# Patient Record
Sex: Female | Born: 1970 | Race: White | Hispanic: No | Smoking: Current every day smoker
Health system: Southern US, Community
[De-identification: ages and names within clinical notes are randomized; demographics above are authoritative.]

## PROBLEM LIST (undated history)

## (undated) DIAGNOSIS — R011 Cardiac murmur, unspecified: Secondary | ICD-10-CM

## (undated) DIAGNOSIS — R87629 Unspecified abnormal cytological findings in specimens from vagina: Secondary | ICD-10-CM

## (undated) HISTORY — DX: Cardiac murmur, unspecified: R01.1

## (undated) HISTORY — PX: APPENDECTOMY: SHX54

## (undated) HISTORY — PX: TUBAL LIGATION: SHX77

## (undated) HISTORY — DX: Unspecified abnormal cytological findings in specimens from vagina: R87.629

---

## 1998-06-12 ENCOUNTER — Encounter: Admission: RE | Admit: 1998-06-12 | Discharge: 1998-09-10 | Payer: Self-pay | Admitting: Family Medicine

## 2010-12-24 ENCOUNTER — Other Ambulatory Visit: Payer: Self-pay | Admitting: Gastroenterology

## 2010-12-24 DIAGNOSIS — R110 Nausea: Secondary | ICD-10-CM

## 2010-12-30 ENCOUNTER — Inpatient Hospital Stay: Admission: RE | Admit: 2010-12-30 | Payer: Self-pay | Source: Ambulatory Visit

## 2013-09-30 ENCOUNTER — Other Ambulatory Visit: Payer: Self-pay | Admitting: Gastroenterology

## 2013-09-30 DIAGNOSIS — R634 Abnormal weight loss: Secondary | ICD-10-CM

## 2013-09-30 DIAGNOSIS — R110 Nausea: Secondary | ICD-10-CM

## 2013-09-30 DIAGNOSIS — R109 Unspecified abdominal pain: Secondary | ICD-10-CM

## 2013-10-04 ENCOUNTER — Ambulatory Visit
Admission: RE | Admit: 2013-10-04 | Discharge: 2013-10-04 | Disposition: A | Discharge status: Home / Self Care | Payer: BC Managed Care – PPO | Source: Ambulatory Visit | Attending: Gastroenterology | Admitting: Gastroenterology

## 2013-10-04 DIAGNOSIS — R110 Nausea: Secondary | ICD-10-CM

## 2013-10-04 DIAGNOSIS — R634 Abnormal weight loss: Secondary | ICD-10-CM

## 2013-10-04 DIAGNOSIS — R109 Unspecified abdominal pain: Secondary | ICD-10-CM

## 2013-10-04 MED ORDER — IOHEXOL 300 MG/ML  SOLN
100.0000 mL | Freq: Once | INTRAMUSCULAR | Status: AC | PRN
  Administered 2013-10-04: 100 mL via INTRAVENOUS

## 2015-11-25 HISTORY — PX: APPENDECTOMY: SHX54

## 2016-02-28 ENCOUNTER — Encounter (HOSPITAL_COMMUNITY): Payer: Self-pay | Admitting: Emergency Medicine

## 2016-02-28 ENCOUNTER — Observation Stay (HOSPITAL_COMMUNITY)
Admission: EM | Admit: 2016-02-28 | Discharge: 2016-03-01 | Disposition: A | Discharge status: Other Acute Inpatient Hospital | Payer: Self-pay | Attending: General Surgery | Admitting: General Surgery

## 2016-02-28 DIAGNOSIS — R1031 Right lower quadrant pain: Secondary | ICD-10-CM

## 2016-02-28 DIAGNOSIS — K3580 Unspecified acute appendicitis: Principal | ICD-10-CM | POA: Insufficient documentation

## 2016-02-28 DIAGNOSIS — F17210 Nicotine dependence, cigarettes, uncomplicated: Secondary | ICD-10-CM | POA: Insufficient documentation

## 2016-02-28 LAB — COMPREHENSIVE METABOLIC PANEL
ALBUMIN: 4.1 g/dL (ref 3.5–5.0)
ALT: 19 U/L (ref 14–54)
ANION GAP: 17 — AB (ref 5–15)
AST: 21 U/L (ref 15–41)
Alkaline Phosphatase: 97 U/L (ref 38–126)
BUN: 8 mg/dL (ref 6–20)
CHLORIDE: 103 mmol/L (ref 101–111)
CO2: 17 mmol/L — AB (ref 22–32)
Calcium: 9.7 mg/dL (ref 8.9–10.3)
Creatinine, Ser: 1.03 mg/dL — ABNORMAL HIGH (ref 0.44–1.00)
GFR calc Af Amer: >60 mL/min (ref >60)
GFR calc non Af Amer: >60 mL/min (ref >60)
GLUCOSE: 120 mg/dL — AB (ref 65–99)
POTASSIUM: 3.9 mmol/L (ref 3.5–5.1)
SODIUM: 137 mmol/L (ref 135–145)
Total Bilirubin: 1.1 mg/dL (ref 0.3–1.2)
Total Protein: 8 g/dL (ref 6.5–8.1)

## 2016-02-28 LAB — URINALYSIS, ROUTINE W REFLEX MICROSCOPIC
Glucose, UA: NEGATIVE mg/dL
Ketones, ur: >80 mg/dL — AB
Leukocytes, UA: NEGATIVE
NITRITE: NEGATIVE
PH: 5.5 (ref 5.0–8.0)
Protein, ur: 30 mg/dL — AB
SPECIFIC GRAVITY, URINE: 1.026 (ref 1.005–1.030)

## 2016-02-28 LAB — URINE MICROSCOPIC-ADD ON

## 2016-02-28 LAB — CBC
HEMATOCRIT: 47.2 % — AB (ref 36.0–46.0)
HEMOGLOBIN: 15.8 g/dL — AB (ref 12.0–15.0)
MCH: 30.5 pg (ref 26.0–34.0)
MCHC: 33.5 g/dL (ref 30.0–36.0)
MCV: 91.1 fL (ref 78.0–100.0)
Platelets: 278 10*3/uL (ref 150–400)
RBC: 5.18 MIL/uL — AB (ref 3.87–5.11)
RDW: 13.5 % (ref 11.5–15.5)
WBC: 14.9 10*3/uL — ABNORMAL HIGH (ref 4.0–10.5)

## 2016-02-28 LAB — LIPASE, BLOOD: LIPASE: 18 U/L (ref 11–51)

## 2016-02-28 MED ORDER — ONDANSETRON 4 MG PO TBDP
ORAL_TABLET | ORAL | Status: AC
  Filled 2016-02-28: qty 1

## 2016-02-28 MED ORDER — ONDANSETRON 4 MG PO TBDP
4.0000 mg | ORAL_TABLET | Freq: Once | ORAL | Status: AC | PRN
  Administered 2016-02-28: 4 mg via ORAL

## 2016-02-28 NOTE — ED Provider Notes (Signed)
CSN: [id number]     Arrival date & time 02/28/16  2229 History   First MD Initiated Contact with Patient 02/28/16 2334     Chief Complaint  Patient presents with  . Abdominal Pain     (Consider location/radiation/quality/duration/timing/severity/associated sxs/prior Treatment) HPI  Morgan Orozco is a 45 year old female with a past medical history of bipolar disorder presents the ED complaining of right lower quadrant abdominal pain. 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 Throughout The day the pain progressively worsened and is now sharp in nature. Pain is worsened with movement and walking. Patient had associated vomiting and diarrhea, nonbloody, nonbilious. Denies fever, dysuria, hematuria, melena, hematochezia. Patient is currently menstruating. Patient has had her tubes tied, no other abdominal surgeries.  History reviewed. No pertinent past medical history. History reviewed. No pertinent past surgical history. No family history on file. Social History  Substance Use Topics  . Smoking status: Current Every Day Smoker -- 0.50 packs/day    Types: Cigarettes  . Smokeless tobacco: None  . Alcohol Use: Yes     Comment: occasional    OB History    No data available     Review of Systems  All other systems reviewed and are negative.     Allergies  Review of patient's allergies indicates no known allergies.  Home Medications   Prior to Admission medications   Not on File   BP 128/95 mmHg  Pulse 109  Temp(Src) 97.9 F (36.6 C) (Oral)  Resp 16  Ht 5\' 8"  (1.727 m)  Wt 99.791 kg  BMI 33.46 kg/m2  SpO2 98%  LMP 02/28/2016 Physical Exam  Constitutional: She is oriented to person, place, and time. She appears well-developed and well-nourished. No distress.  HENT:  Head: Normocephalic and atraumatic.  Mouth/Throat: No oropharyngeal exudate.  Eyes: Conjunctivae and EOM are normal.  Pupils are equal, round, and reactive to light. Right eye exhibits no discharge. Left eye exhibits no discharge. No scleral icterus.  Cardiovascular: Normal rate, regular rhythm, normal heart sounds and intact distal pulses.  Exam reveals no gallop and no friction rub.   No murmur heard. Pulmonary/Chest: Effort normal and breath sounds normal. No respiratory distress. She has no wheezes. She has no rales. She exhibits no tenderness.  Abdominal: Soft. She exhibits no distension. There is tenderness. There is rebound. There is no guarding.  Positive Rosving's. Rebound tenderness present. Positive Heel Jar. No CVA tenderness.  Musculoskeletal: Normal range of motion. She exhibits no edema.  Neurological: She is alert and oriented to person, place, and time.  Skin: Skin is warm and dry. No rash noted. She is not diaphoretic. No erythema. No pallor.  Psychiatric: She has a normal mood and affect. Her behavior is normal.  Nursing note and vitals reviewed.   ED Course  Procedures (including critical care time) Labs Review Labs Reviewed  COMPREHENSIVE METABOLIC PANEL - Abnormal; Notable for the following:    CO2 17 (*)    Glucose, Bld 120 (*)    Creatinine, Ser 1.03 (*)    Anion gap 17 (*)    All other components within normal limits  CBC - Abnormal; Notable for the following:    WBC 14.9 (*)    RBC 5.18 (*)    Hemoglobin 15.8 (*)    HCT 47.2 (*)    All other components within normal limits  URINALYSIS, ROUTINE W REFLEX MICROSCOPIC (  NOT AT [facility]) - Abnormal; Notable for the following:    Color, Urine AMBER (*)    APPearance TURBID (*)    Hgb urine dipstick MODERATE (*)    Bilirubin Urine MODERATE (*)    Ketones, ur >80 (*)    Protein, ur 30 (*)    All other components within normal limits  URINE MICROSCOPIC-ADD ON - Abnormal; Notable for the following:    Squamous Epithelial / LPF 6-30 (*)    Bacteria, UA RARE (*)    Casts HYALINE CASTS (*)    All other components within normal limits   LIPASE, BLOOD    Imaging Review No results found. I have personally reviewed and evaluated these images and lab results as part of my medical decision-making.   EKG Interpretation None      MDM   Final diagnoses:  Right lower quadrant abdominal pain     45 year old female presents the ED with gradual onset and progressively worsening right lower quadrant abdominal pain associated with vomiting and diarrhea. Patient is significantly tender on abdominal exam with guarding. Leukocytosis present to be BC 14.9. Concern for appendicitis. We'll obtain CT to evaluate. Pain managed in ED.  Pt signed out to Dr. Leonides Schanz at shift change pending CT results. If positive for appendicitis, will consult surgery and treat accordingly.   Patient was discussed with and seen by Dr. Leonides Schanz who agrees with the treatment plan.      [place], PA-C 02/29/16 [place], DO 02/29/16 [phone]

## 2016-02-28 NOTE — ED Notes (Signed)
Pt states she woke up at 1400 today and had serve RLQ pain with n/v/d.

## 2016-02-29 ENCOUNTER — Encounter (HOSPITAL_COMMUNITY): Payer: Self-pay | Admitting: Anesthesiology

## 2016-02-29 ENCOUNTER — Emergency Department (HOSPITAL_COMMUNITY): Payer: Self-pay

## 2016-02-29 ENCOUNTER — Observation Stay (HOSPITAL_COMMUNITY): Payer: Self-pay | Admitting: Anesthesiology

## 2016-02-29 ENCOUNTER — Encounter (HOSPITAL_COMMUNITY)
Admission: EM | Disposition: A | Discharge status: Other Acute Inpatient Hospital | Payer: Self-pay | Source: Home / Self Care | Attending: Emergency Medicine

## 2016-02-29 DIAGNOSIS — K3580 Unspecified acute appendicitis: Secondary | ICD-10-CM | POA: Diagnosis present

## 2016-02-29 HISTORY — PX: LAPAROSCOPIC APPENDECTOMY: SHX408

## 2016-02-29 LAB — PREGNANCY, URINE: PREG TEST UR: NEGATIVE

## 2016-02-29 SURGERY — APPENDECTOMY, LAPAROSCOPIC
Anesthesia: General

## 2016-02-29 MED ORDER — ONDANSETRON 4 MG PO TBDP: 4.0000 mg | ORAL_TABLET | Freq: Four times a day (QID) | ORAL | Status: DC | PRN

## 2016-02-29 MED ORDER — SODIUM CHLORIDE 0.9 % IV BOLUS (SEPSIS)
500.0000 mL | Freq: Once | INTRAVENOUS | Status: AC
  Administered 2016-02-29: 500 mL via INTRAVENOUS

## 2016-02-29 MED ORDER — CEFTRIAXONE SODIUM 2 G IJ SOLR
2.0000 g | INTRAMUSCULAR | Status: DC
  Administered 2016-03-01: 2 g via INTRAVENOUS
  Filled 2016-02-29: qty 2

## 2016-02-29 MED ORDER — HEPARIN SODIUM (PORCINE) 5000 UNIT/ML IJ SOLN
5000.0000 [IU] | Freq: Three times a day (TID) | INTRAMUSCULAR | Status: DC
  Administered 2016-03-01: 5000 [IU] via SUBCUTANEOUS
  Filled 2016-02-29 (×2): qty 1

## 2016-02-29 MED ORDER — ENOXAPARIN SODIUM 40 MG/0.4ML ~~LOC~~ SOLN
40.0000 mg | SUBCUTANEOUS | Status: DC
  Filled 2016-02-29: qty 0.4

## 2016-02-29 MED ORDER — MORPHINE SULFATE (PF) 4 MG/ML IV SOLN
4.0000 mg | Freq: Once | INTRAVENOUS | Status: AC
  Administered 2016-02-29: 4 mg via INTRAVENOUS
  Filled 2016-02-29: qty 1

## 2016-02-29 MED ORDER — METRONIDAZOLE IN NACL 5-0.79 MG/ML-% IV SOLN
500.0000 mg | Freq: Once | INTRAVENOUS | Status: AC
  Administered 2016-02-29: 500 mg via INTRAVENOUS
  Filled 2016-02-29: qty 100

## 2016-02-29 MED ORDER — ACETAMINOPHEN 325 MG PO TABS: 650.0000 mg | ORAL_TABLET | Freq: Four times a day (QID) | ORAL | Status: DC | PRN

## 2016-02-29 MED ORDER — ONDANSETRON HCL 4 MG/2ML IJ SOLN
4.0000 mg | Freq: Once | INTRAMUSCULAR | Status: AC
  Administered 2016-02-29: 4 mg via INTRAVENOUS
  Filled 2016-02-29: qty 2

## 2016-02-29 MED ORDER — ONDANSETRON HCL 4 MG/2ML IJ SOLN: 4.0000 mg | Freq: Four times a day (QID) | INTRAMUSCULAR | Status: DC | PRN

## 2016-02-29 MED ORDER — LACTATED RINGERS IV SOLN
INTRAVENOUS | Status: DC | PRN
  Administered 2016-02-29 (×2): via INTRAVENOUS

## 2016-02-29 MED ORDER — SUGAMMADEX SODIUM 200 MG/2ML IV SOLN
INTRAVENOUS | Status: DC | PRN
  Administered 2016-02-29: 200 mg via INTRAVENOUS

## 2016-02-29 MED ORDER — MEPERIDINE HCL 50 MG/ML IJ SOLN: 6.2500 mg | INTRAMUSCULAR | Status: DC | PRN

## 2016-02-29 MED ORDER — METOCLOPRAMIDE HCL 5 MG/ML IJ SOLN: 10.0000 mg | Freq: Once | INTRAMUSCULAR | Status: DC | PRN

## 2016-02-29 MED ORDER — DIATRIZOATE MEGLUMINE & SODIUM 66-10 % PO SOLN
ORAL | Status: AC
  Filled 2016-02-29: qty 30

## 2016-02-29 MED ORDER — MORPHINE SULFATE (PF) 2 MG/ML IV SOLN
1.0000 mg | INTRAVENOUS | Status: DC | PRN
  Administered 2016-02-29 (×3): 2 mg via INTRAVENOUS
  Filled 2016-02-29 (×3): qty 1

## 2016-02-29 MED ORDER — LACTATED RINGERS IV SOLN
INTRAVENOUS | Status: DC | PRN
  Administered 2016-02-29: 2000 mL

## 2016-02-29 MED ORDER — ONDANSETRON HCL 4 MG/2ML IJ SOLN: 4.0000 mg | INTRAMUSCULAR | Status: DC | PRN

## 2016-02-29 MED ORDER — IOPAMIDOL (ISOVUE-300) INJECTION 61%
INTRAVENOUS | Status: AC
  Administered 2016-02-29: 100 mL
  Filled 2016-02-29: qty 100

## 2016-02-29 MED ORDER — BUPIVACAINE HCL (PF) 0.5 % IJ SOLN
INTRAMUSCULAR | Status: DC | PRN
  Administered 2016-02-29: 20 mL

## 2016-02-29 MED ORDER — 0.9 % SODIUM CHLORIDE (POUR BTL) OPTIME
TOPICAL | Status: DC | PRN
  Administered 2016-02-29: 1000 mL

## 2016-02-29 MED ORDER — ROCURONIUM BROMIDE 100 MG/10ML IV SOLN
INTRAVENOUS | Status: DC | PRN
  Administered 2016-02-29: 30 mg via INTRAVENOUS
  Administered 2016-02-29 (×2): 10 mg via INTRAVENOUS

## 2016-02-29 MED ORDER — LIDOCAINE HCL (CARDIAC) 20 MG/ML IV SOLN
INTRAVENOUS | Status: DC | PRN
  Administered 2016-02-29: 100 mg via INTRAVENOUS

## 2016-02-29 MED ORDER — FENTANYL CITRATE (PF) 100 MCG/2ML IJ SOLN
INTRAMUSCULAR | Status: AC
  Filled 2016-02-29: qty 2

## 2016-02-29 MED ORDER — DEXAMETHASONE SODIUM PHOSPHATE 10 MG/ML IJ SOLN
INTRAMUSCULAR | Status: DC | PRN
  Administered 2016-02-29: 10 mg via INTRAVENOUS

## 2016-02-29 MED ORDER — ONDANSETRON HCL 4 MG/2ML IJ SOLN
INTRAMUSCULAR | Status: DC | PRN
  Administered 2016-02-29: 4 mg via INTRAVENOUS

## 2016-02-29 MED ORDER — FENTANYL CITRATE (PF) 100 MCG/2ML IJ SOLN
INTRAMUSCULAR | Status: DC | PRN
  Administered 2016-02-29 (×2): 50 ug via INTRAVENOUS
  Administered 2016-02-29: 150 ug via INTRAVENOUS
  Administered 2016-02-29 (×2): 50 ug via INTRAVENOUS

## 2016-02-29 MED ORDER — HYDROCODONE-ACETAMINOPHEN 5-325 MG PO TABS
1.0000 | ORAL_TABLET | ORAL | Status: DC | PRN
  Administered 2016-02-29 – 2016-03-01 (×4): 2 via ORAL
  Filled 2016-02-29 (×4): qty 2

## 2016-02-29 MED ORDER — METRONIDAZOLE IN NACL 5-0.79 MG/ML-% IV SOLN
500.0000 mg | Freq: Three times a day (TID) | INTRAVENOUS | Status: DC
  Administered 2016-02-29 – 2016-03-01 (×3): 500 mg via INTRAVENOUS
  Filled 2016-02-29 (×3): qty 100

## 2016-02-29 MED ORDER — FENTANYL CITRATE (PF) 100 MCG/2ML IJ SOLN
25.0000 ug | INTRAMUSCULAR | Status: DC | PRN
  Administered 2016-02-29 (×2): 25 ug via INTRAVENOUS
  Administered 2016-02-29: 50 ug via INTRAVENOUS

## 2016-02-29 MED ORDER — DEXTROSE 5 % IV SOLN
1.0000 g | Freq: Once | INTRAVENOUS | Status: AC
  Administered 2016-02-29: 1 g via INTRAVENOUS
  Filled 2016-02-29: qty 10

## 2016-02-29 MED ORDER — DEXTROSE-NACL 5-0.9 % IV SOLN
INTRAVENOUS | Status: DC
  Administered 2016-02-29: 100 mL/h via INTRAVENOUS
  Administered 2016-02-29: 14:00:00 via INTRAVENOUS

## 2016-02-29 MED ORDER — MORPHINE SULFATE (PF) 2 MG/ML IV SOLN
2.0000 mg | INTRAVENOUS | Status: DC | PRN
  Administered 2016-02-29 – 2016-03-01 (×3): 4 mg via INTRAVENOUS
  Administered 2016-03-01: 2 mg via INTRAVENOUS
  Filled 2016-02-29: qty 2
  Filled 2016-02-29: qty 1
  Filled 2016-02-29 (×2): qty 2

## 2016-02-29 MED ORDER — MIDAZOLAM HCL 2 MG/2ML IJ SOLN
INTRAMUSCULAR | Status: DC | PRN
  Administered 2016-02-29: 2 mg via INTRAVENOUS

## 2016-02-29 MED ORDER — PROPOFOL 10 MG/ML IV BOLUS
INTRAVENOUS | Status: DC | PRN
  Administered 2016-02-29: 150 mg via INTRAVENOUS

## 2016-02-29 MED ORDER — SODIUM CHLORIDE 0.9 % IV BOLUS (SEPSIS)
1000.0000 mL | Freq: Once | INTRAVENOUS | Status: AC
  Administered 2016-02-29: 1000 mL via INTRAVENOUS

## 2016-02-29 MED ORDER — SUCCINYLCHOLINE CHLORIDE 20 MG/ML IJ SOLN
INTRAMUSCULAR | Status: DC | PRN
  Administered 2016-02-29: 100 mg via INTRAVENOUS

## 2016-02-29 MED ORDER — KCL IN DEXTROSE-NACL 20-5-0.9 MEQ/L-%-% IV SOLN
INTRAVENOUS | Status: DC
  Filled 2016-02-29 (×3): qty 1000

## 2016-02-29 MED ORDER — ACETAMINOPHEN 650 MG RE SUPP: 650.0000 mg | Freq: Four times a day (QID) | RECTAL | Status: DC | PRN

## 2016-02-29 MED ORDER — BUPIVACAINE HCL (PF) 0.5 % IJ SOLN
INTRAMUSCULAR | Status: AC
  Filled 2016-02-29: qty 30

## 2016-02-29 SURGICAL SUPPLY — 50 items
APL SKNCLS STERI-STRIP NONHPOA (GAUZE/BANDAGES/DRESSINGS) ×1
APPLIER CLIP 5 13 M/L LIGAMAX5 (MISCELLANEOUS)
APPLIER CLIP ROT 10 11.4 M/L (STAPLE)
APR CLP MED LRG 11.4X10 (STAPLE)
APR CLP MED LRG 5 ANG JAW (MISCELLANEOUS)
BAG SPEC RTRVL LRG 6X4 10 (ENDOMECHANICALS) ×1
BENZOIN TINCTURE PRP APPL 2/3 (GAUZE/BANDAGES/DRESSINGS) ×3 IMPLANT
CHLORAPREP W/TINT 26ML (MISCELLANEOUS) ×3 IMPLANT
CLIP APPLIE 5 13 M/L LIGAMAX5 (MISCELLANEOUS) IMPLANT
CLIP APPLIE ROT 10 11.4 M/L (STAPLE) IMPLANT
CLOSURE WOUND 1/2 X4 (GAUZE/BANDAGES/DRESSINGS) ×1
COVER SURGICAL LIGHT HANDLE (MISCELLANEOUS) ×3 IMPLANT
CUTTER FLEX LINEAR 45M (STAPLE) ×2 IMPLANT
DECANTER SPIKE VIAL GLASS SM (MISCELLANEOUS) ×3 IMPLANT
DRAIN CHANNEL 19F RND (DRAIN) IMPLANT
DRAPE LAPAROSCOPIC ABDOMINAL (DRAPES) ×3 IMPLANT
DRSG TEGADERM 2-3/8X2-3/4 SM (GAUZE/BANDAGES/DRESSINGS) ×4 IMPLANT
ELECT REM PT RETURN 9FT ADLT (ELECTROSURGICAL) ×3
ELECTRODE REM PT RTRN 9FT ADLT (ELECTROSURGICAL) ×1 IMPLANT
ENDOLOOP SUT PDS II  0 18 (SUTURE)
ENDOLOOP SUT PDS II 0 18 (SUTURE) IMPLANT
EVACUATOR SILICONE 100CC (DRAIN) IMPLANT
GAUZE SPONGE 2X2 8PLY STRL LF (GAUZE/BANDAGES/DRESSINGS) ×1 IMPLANT
GLOVE ECLIPSE 8.0 STRL XLNG CF (GLOVE) ×3 IMPLANT
GLOVE INDICATOR 8.0 STRL GRN (GLOVE) ×3 IMPLANT
GOWN STRL REUS W/TWL XL LVL3 (GOWN DISPOSABLE) ×6 IMPLANT
KIT BASIN OR (CUSTOM PROCEDURE TRAY) ×3 IMPLANT
POUCH SPECIMEN RETRIEVAL 10MM (ENDOMECHANICALS) ×3 IMPLANT
RELOAD 45 VASCULAR/THIN (ENDOMECHANICALS) IMPLANT
RELOAD STAPLE 45 2.5 WHT GRN (ENDOMECHANICALS) IMPLANT
RELOAD STAPLE 45 3.5 BLU ETS (ENDOMECHANICALS) IMPLANT
RELOAD STAPLE TA45 3.5 REG BLU (ENDOMECHANICALS) ×3 IMPLANT
SCISSORS LAP 5X35 DISP (ENDOMECHANICALS) IMPLANT
SET IRRIG TUBING LAPAROSCOPIC (IRRIGATION / IRRIGATOR) ×3 IMPLANT
SHEARS HARMONIC ACE PLUS 36CM (ENDOMECHANICALS) ×3 IMPLANT
SLEEVE XCEL OPT CAN 5 100 (ENDOMECHANICALS) ×3 IMPLANT
SOLUTION ANTI FOG 6CC (MISCELLANEOUS) ×3 IMPLANT
SPONGE GAUZE 2X2 STER 10/PKG (GAUZE/BANDAGES/DRESSINGS) ×2
STRIP CLOSURE SKIN 1/2X4 (GAUZE/BANDAGES/DRESSINGS) ×2 IMPLANT
SUT ETHILON 3 0 PS 1 (SUTURE) IMPLANT
SUT MNCRL AB 4-0 PS2 18 (SUTURE) ×3 IMPLANT
TAPE STRIPS DRAPE STRL (GAUZE/BANDAGES/DRESSINGS) ×2 IMPLANT
TOWEL OR 17X26 10 PK STRL BLUE (TOWEL DISPOSABLE) ×3 IMPLANT
TOWEL OR NON WOVEN STRL DISP B (DISPOSABLE) ×3 IMPLANT
TRAY FOLEY W/METER SILVER 14FR (SET/KITS/TRAYS/PACK) ×3 IMPLANT
TRAY FOLEY W/METER SILVER 16FR (SET/KITS/TRAYS/PACK) ×3 IMPLANT
TRAY LAPAROSCOPIC (CUSTOM PROCEDURE TRAY) ×3 IMPLANT
TROCAR BLADELESS OPT 5 100 (ENDOMECHANICALS) ×3 IMPLANT
TROCAR XCEL BLUNT TIP 100MML (ENDOMECHANICALS) ×3 IMPLANT
TUBING INSUF HEATED (TUBING) ×3 IMPLANT

## 2016-02-29 NOTE — Anesthesia Postprocedure Evaluation (Signed)
Anesthesia Post Note  Patient: Morgan Orozco  Procedure(s) Performed: Procedure(s) (LRB): APPENDECTOMY LAPAROSCOPIC (N/A)  Patient location during evaluation: PACU Anesthesia Type: General Level of consciousness: awake and alert and oriented Pain management: pain level controlled Vital Signs Assessment: post-procedure vital signs reviewed and stable Respiratory status: spontaneous breathing, nonlabored ventilation, respiratory function stable and patient connected to nasal cannula oxygen Cardiovascular status: blood pressure returned to baseline and stable Postop Assessment: no signs of nausea or vomiting Anesthetic complications: no    Last Vitals:  Filed Vitals:   02/29/16 1253 02/29/16 1302  BP:  126/75  Pulse:  79  Temp: 36 C 37.3 C  Resp:  16    Last Pain:  Filed Vitals:   02/29/16 1303  PainSc: 5                  Syriana Croslin A.

## 2016-02-29 NOTE — ED Notes (Signed)
Lab to add urine pregnancy

## 2016-02-29 NOTE — Op Note (Signed)
Appendectomy, Lap, Procedure Note  Pre-operative Diagnosis:  Acute appendicitis  Post-operative Diagnosis: Same with early gangrenous changes  Procedure:  Laparoscopic appendectomy  Surgeon:  Jackolyn Confer, M.D.  Anesthesia:  General   Indications:  This is a 45 year old female with clinical and radiographic findings consistent with acute appendicitis.  She is brought to the OR for the above procedure.   Procedure Details   She was brought to the operating room, placed in the supine position and general anesthesia was induced, along with placement of orogastric tube, SCDs, and a Foley catheter. A timeout was performed. The abdomen was prepped and draped in a sterile fashion. A small infraumbilical incision was made, through a previous subumbilical scar, in the skin, subcutaneous tissue, fascia, and peritoneum entering the peritoneal cavity under direct vision.  A pursestring suture was passed around the fascia with a 0 Vicryl.  The Hasson was introduced into the peritoneal cavity and the tails of the suture were used to hold the Hasson in place.   The pneumoperitoneum was then established to steady pressure of 15 mmHg.   The laparoscope was introduced and there was no evidence of bleeding or underlying organ injury. Additional 5 mm cannulas then placed in the left lower quadrant of the abdomen and the right upper quadrant region under direct visualization. A careful evaluation of the entire abdomen was carried out. She was placed in Trendelenburg and left lateral decubitus position. The small intestines were retracted in the cephalad and left lateral direction away from the pelvis and right lower quadrant. The patient was found to have an enlarged and inflamed appendix that was extending into the pelvis with early gangrenous changes.  The tip was adherent to pericolonic fat on the sigmoid colon and this was separated bluntly. There was no evidence of perforation.  The appendix was carefully  mobilized. The mesoappendix was was divided with the harmonic scalpel.   The appendix was amputated off the cecum, with a small cuff of cecum, using an endo-GIA stapler.  The appendix was placed in a retrieval bag and removed through the subumbilical port incision.    There was no evidence of bleeding, leakage, or complication after division of the appendix. Copious irrigation was  performed and irrigant fluid suctioned from the abdomen as much as possible.  The umbilical trocar was removed and the  port site fascia was closed via the purse string suture under laparoscopic vision. There was no residual palpable fascial defect.  The remaining trocars were removed and all  trocar site skin wounds were closed with 4-0 Monocryl.  Steri dressings were applied.  Instrument, sponge, and needle counts were correct at the conclusion of the case.    Estimated Blood Loss:  less than 100 mL                Specimens: appendix         Complications:  None; patient tolerated the procedure well.         Disposition: PACU - hemodynamically stable.         Condition: stable

## 2016-02-29 NOTE — Transfer of Care (Signed)
Immediate Anesthesia Transfer of Care Note  Patient: Morgan Orozco  Procedure(s) Performed: Procedure(s): APPENDECTOMY LAPAROSCOPIC (N/A)  Patient Location: PACU  Anesthesia Type:General  Level of Consciousness: awake, alert  and oriented  Airway & Oxygen Therapy: Patient Spontanous Breathing and Patient connected to face mask oxygen  Post-op Assessment: Report given to RN and Post -op Vital signs reviewed and stable  Post vital signs: Reviewed and stable  Last Vitals:  Filed Vitals:   02/29/16 0915 02/29/16 0922  BP: 118/55   Pulse: 77   Temp:  37.6 C  Resp:      Complications: No apparent anesthesia complications

## 2016-02-29 NOTE — Anesthesia Procedure Notes (Signed)
Procedure Name: Intubation Date/Time: 02/29/2016 10:44 AM Performed by: Danley Danker L Patient Re-evaluated:Patient Re-evaluated prior to inductionOxygen Delivery Method: Circle system utilized Preoxygenation: Pre-oxygenation with 100% oxygen Intubation Type: IV induction, Rapid sequence and Cricoid Pressure applied Laryngoscope Size: Miller and 2 Grade View: Grade I Tube type: Oral Tube size: 7.5 mm Number of attempts: 1 Airway Equipment and Method: Stylet Placement Confirmation: ETT inserted through vocal cords under direct vision,  positive ETCO2 and breath sounds checked- equal and bilateral Secured at: 21 cm Tube secured with: Tape Dental Injury: Teeth and Oropharynx as per pre-operative assessment

## 2016-02-29 NOTE — Anesthesia Preprocedure Evaluation (Addendum)
Anesthesia Evaluation  Patient identified by MRN, date of birth, ID band Patient awake    Reviewed: Allergy & Precautions, NPO status , Patient's Chart, lab work & pertinent test results  Airway Mallampati: II  TM Distance: >3 FB Neck ROM: Full    Dental  (+) Teeth Intact   Pulmonary Current Smoker,    Pulmonary exam normal breath sounds clear to auscultation       Cardiovascular negative cardio ROS Normal cardiovascular exam Rhythm:Regular Rate:Normal     Neuro/Psych negative neurological ROS  negative psych ROS   GI/Hepatic Neg liver ROS, Acute appendicitis   Endo/Other  Obesity  Renal/GU negative Renal ROS  negative genitourinary   Musculoskeletal negative musculoskeletal ROS (+)   Abdominal (+) + obese,  Abdomen: tender.    Peds  Hematology negative hematology ROS (+)   Anesthesia Other Findings   Reproductive/Obstetrics negative OB ROS                           Anesthesia Physical Anesthesia Plan  ASA: II and emergent  Anesthesia Plan: General   Post-op Pain Management:    Induction: Intravenous, Rapid sequence and Cricoid pressure planned  Airway Management Planned: Oral ETT  Additional Equipment:   Intra-op Plan:   Post-operative Plan: Extubation in OR  Informed Consent: I have reviewed the patients History and Physical, chart, labs and discussed the procedure including the risks, benefits and alternatives for the proposed anesthesia with the patient or authorized representative who has indicated his/her understanding and acceptance.   Dental advisory given  Plan Discussed with: CRNA, Anesthesiologist and Surgeon  Anesthesia Plan Comments:         Anesthesia Quick Evaluation

## 2016-02-29 NOTE — H&P (Signed)
Chief Complaint: abdominal pain HPI: Morgan Orozco is a 45 year old female who presents with RLQ abdominal pain which started yesterday at [place] with nausea, vomiting, chills, sweats, malaise and diarrhea.  Denies fevers.  Denies previous symptoms.  No aggravating or alleviating factors. No modifying factors.  Work up reveals WBC 14.9k.  CT a/p consistent with acute appendicitis.  We have therefore been asked to evaluate.   History reviewed. No pertinent past medical history.  History reviewed. No pertinent past surgical history.  No family history on file. Social History:  reports that she has been smoking Cigarettes.  She has been smoking about 0.50 packs per day. She does not have any smokeless tobacco history on file. She reports that she drinks alcohol. Her drug history is not on file.  Allergies: No Known Allergies   (Not in a hospital admission)  Results for orders placed or performed during the hospital encounter of 02/28/16 (from the past 48 hour(s))  Lipase, blood     Status: None   Collection Time: 02/28/16 10:49 PM  Result Value Ref Range   Lipase 18 11 - 51 U/L  Comprehensive metabolic panel     Status: Abnormal   Collection Time: 02/28/16 10:49 PM  Result Value Ref Range   Sodium 137 135 - 145 mmol/L   Potassium 3.9 3.5 - 5.1 mmol/L   Chloride 103 101 - 111 mmol/L   CO2 17 (L) 22 - 32 mmol/L   Glucose, Bld 120 (H) 65 - 99 mg/dL   BUN 8 6 - 20 mg/dL   Creatinine, Ser 1.03 (H) 0.44 - 1.00 mg/dL   Calcium 9.7 8.9 - 10.3 mg/dL   Total Protein 8.0 6.5 - 8.1 g/dL   Albumin 4.1 3.5 - 5.0 g/dL   AST 21 15 - 41 U/L   ALT 19 14 - 54 U/L   Alkaline Phosphatase 97 38 - 126 U/L   Total Bilirubin 1.1 0.3 - 1.2 mg/dL   GFR calc non Af Amer >60 >60 mL/min   GFR calc Af Amer >60 >60 mL/min    Comment: (NOTE) The eGFR has been calculated using the CKD EPI equation. This calculation has not been validated in all clinical situations. eGFR's persistently <60 mL/min  signify possible Chronic Kidney Disease.    Anion gap 17 (H) 5 - 15  CBC     Status: Abnormal   Collection Time: 02/28/16 10:49 PM  Result Value Ref Range   WBC 14.9 (H) 4.0 - 10.5 K/uL   RBC 5.18 (H) 3.87 - 5.11 MIL/uL   Hemoglobin 15.8 (H) 12.0 - 15.0 g/dL   HCT 47.2 (H) 36.0 - 46.0 %   MCV 91.1 78.0 - 100.0 fL   MCH 30.5 26.0 - 34.0 pg   MCHC 33.5 30.0 - 36.0 g/dL   RDW 13.5 11.5 - 15.5 %   Platelets 278 150 - 400 K/uL  Urinalysis, Routine w reflex microscopic (not at [facility])     Status: Abnormal   Collection Time: 02/28/16 10:49 PM  Result Value Ref Range   Color, Urine AMBER (A) YELLOW    Comment: BIOCHEMICALS MAY BE AFFECTED BY COLOR   APPearance TURBID (A) CLEAR   Specific Gravity, Urine 1.026 1.005 - 1.030   pH 5.5 5.0 - 8.0   Glucose, UA NEGATIVE NEGATIVE mg/dL   Hgb urine dipstick MODERATE (A) NEGATIVE   Bilirubin Urine MODERATE (A) NEGATIVE   Ketones, ur >80 (A) NEGATIVE mg/dL   Protein, ur 30 (A)  NEGATIVE mg/dL   Nitrite NEGATIVE NEGATIVE   Leukocytes, UA NEGATIVE NEGATIVE  Urine microscopic-add on     Status: Abnormal   Collection Time: 02/28/16 10:49 PM  Result Value Ref Range   Squamous Epithelial / LPF 6-30 (A) NONE SEEN   WBC, UA 0-5 0 - 5 WBC/hpf   RBC / HPF 6-30 0 - 5 RBC/hpf   Bacteria, UA RARE (A) NONE SEEN   Casts HYALINE CASTS (A) NEGATIVE  Pregnancy, urine     Status: None   Collection Time: 02/28/16 10:49 PM  Result Value Ref Range   Preg Test, Ur NEGATIVE NEGATIVE    Comment:        THE SENSITIVITY OF THIS METHODOLOGY IS >20 mIU/mL.    Ct Abdomen Pelvis W Contrast  02/29/2016  CLINICAL DATA:  Right lower quadrant abdominal pain. Nausea vomiting and diarrhea, onset yesterday. Leukocytosis. EXAM: CT ABDOMEN AND PELVIS WITH CONTRAST TECHNIQUE: Multidetector CT imaging of the abdomen and pelvis was performed using the standard protocol following bolus administration of intravenous contrast. CONTRAST:  180m ISOVUE-300 IOPAMIDOL (ISOVUE-300)  INJECTION 61% COMPARISON:  CT 10/04/2013 FINDINGS: Lower chest:  The included lung bases are clear. Liver: Tiny hepatic hypodensity at the dome, unchanged. Focal fatty infiltration adjacent with falciform ligament. No suspicious lesion. Hepatobiliary: Gallbladder physiologically distended, no calcified stone. No biliary dilatation. Pancreas: No ductal dilatation or inflammation. Spleen: Normal. Adrenal glands: No nodule. Kidneys: Symmetric renal enhancement and excretion. No hydronephrosis. Stomach/Bowel: Stomach physiologically distended. There are no dilated or thickened small bowel loops. Small volume of stool throughout the colon without colonic wall thickening. Appendix: Dilated and fluid-filled measuring 1.3 cm containing small appendicoliths. Moderate periappendiceal inflammatory change. The appendix courses medial to the cecum in the anterior pelvis. No perforation or abscess. Vascular/Lymphatic: No retroperitoneal adenopathy. Abdominal aorta is normal in caliber. Trace atherosclerosis. Reproductive: Uterus is normal in size. Tampon in place. Prominent left adnexal vascularity and dilatation of the ovarian vein measuring 8 mm. Ovaries symmetric in size. Bladder: Bladder minimally distended. Other: No free air, free fluid, or intra-abdominal fluid collection. Musculoskeletal: There are no acute or suspicious osseous abnormalities. Mild degenerative change in the spine. IMPRESSION: 1. Uncomplicated acute appendicitis. 2. Prominent left adnexal vascularity and dilatation of the ovarian vein, can be seen with pelvic congestion. Electronically Signed   By: MJeb LeveringM.D.   On: 02/29/2016 06:40    Review of Systems  Constitutional: Positive for chills, malaise/fatigue and diaphoresis. Negative for fever and weight loss.  Eyes: Negative for blurred vision, double vision, photophobia, pain, discharge and redness.  Respiratory: Negative for cough, hemoptysis, sputum production, shortness of breath and  wheezing.   Cardiovascular: Negative for chest pain, palpitations, orthopnea, claudication, leg swelling and PND.  Gastrointestinal: Positive for nausea, vomiting, abdominal pain and diarrhea. Negative for constipation, blood in stool and melena.  Genitourinary: Negative for dysuria, urgency, frequency, hematuria and flank pain.  Musculoskeletal: Negative for myalgias, back pain, joint pain, falls and neck pain.  Neurological: Negative for dizziness, tingling, tremors, sensory change, speech change, focal weakness, seizures, loss of consciousness and weakness.    Blood pressure 120/61, pulse 99, temperature 97.9 F (36.6 C), temperature source Oral, resp. rate 16, height _0  (1.727 m), weight 99.791 kg (220 lb), last menstrual period 02/28/2016, SpO2 96 %. Physical Exam  Constitutional: She is oriented to person, place, and time. She appears well-developed and well-nourished. No distress.  HENT:  Head: Normocephalic and atraumatic.  Mouth/Throat: No oropharyngeal exudate.  Eyes: Conjunctivae and  EOM are normal. Pupils are equal, round, and reactive to light. Right eye exhibits no discharge. Left eye exhibits no discharge. No scleral icterus.  Neck: Normal range of motion. Neck supple.  Cardiovascular: Normal rate, regular rhythm and normal heart sounds.  Exam reveals no gallop and no friction rub.   No murmur heard. Respiratory: Effort normal and breath sounds normal. No respiratory distress. She has no wheezes. She has no rales. She exhibits no tenderness.  GI: Soft. Bowel sounds are normal. She exhibits no distension and no mass. There is no guarding.  TTP RLQ, rebound tenderness  Musculoskeletal: Normal range of motion. She exhibits no edema or tenderness.  Neurological: She is alert and oriented to person, place, and time.  Skin: Skin is warm and dry. No rash noted. She is not diaphoretic. No erythema. No pallor.  Psychiatric: She has a normal mood and affect. Her behavior is normal.  Judgment and thought content normal.     Assessment/Plan Acute appendicitis-due to OR availability, will transfer to [facility] for a laparoscopic appendectomy. Surgical risks discussed including infection, bleeding, injury to surrounding structures, open surgery, respiratory compromise, heart attack, DVT/PE.  The patient verbalizes understanding and wishes to proceed. ID-rocephin and flagyl  FEN-NPO, IVF [place] holding area.  EDP arranged care link.  OR aware.  Erby Pian, NP  Pager [phone] 02/29/2016, 7:38 AM

## 2016-03-01 MED ORDER — OXYCODONE HCL 5 MG PO TABS: 5.0000 mg | ORAL_TABLET | ORAL | Status: DC | PRN

## 2016-03-01 NOTE — Discharge Summary (Signed)
Physician Discharge Summary  Patient ID: Morgan Orozco MRN: [id number] DOB/AGE: [birth year] 45 y.o.  Admit date: 02/28/2016 Discharge date: 03/01/2016  Admission Diagnoses:  Acute appendiciti  Discharge Diagnoses:  Active Problems:   Acute appendicitis   Discharged Condition: good  Hospital Course: She was admitted to [facility] and taken to the OR for laparoscopic appendectomy which she tolerated well.  She was able to be discharged on POD#1. Discharge instructions were given to her.   Discharge Exam: Blood pressure 109/79, pulse 70, temperature 98.3 F (36.8 C), temperature source Oral, resp. rate 16, height 5\' 8"  (1.727 m), weight 90.719 kg (200 lb), last menstrual period 02/28/2016, SpO2 100 %.   Disposition: Discharge home.     Medication List    TAKE these medications        oxyCODONE 5 MG immediate release tablet  Commonly known as:  Oxy IR/ROXICODONE  Take 1-2 tablets (5-10 mg total) by mouth every 4 (four) hours as needed for moderate pain, severe pain or breakthrough pain.         Signed: Odis Hollingshead 03/01/2016, 9:21 AM

## 2016-03-01 NOTE — Progress Notes (Signed)
1 Day Post-Op  Subjective: Very sore.  Tolerating diet.  Getting OOB and walking to bathroom.  Objective: Vital signs in last 24 hours: Temp:  [96.8 F (36 C)-99.7 F (37.6 C)] 98.3 F (36.8 C) (04/08 0538) Pulse Rate:  [58-115] 70 (04/08 0538) Resp:  [16-26] 16 (04/08 0538) BP: (109-144)/(58-79) 109/79 mmHg (04/08 0538) SpO2:  [91 %-100 %] 100 % (04/08 0538) Weight:  [90.719 kg (200 lb)] 90.719 kg (200 lb) (04/07 1302) Last BM Date: 02/29/16  Intake/Output from previous day: 04/07 0701 - 04/08 0700 In: 2340 [P.O.:1140; I.V.:1200] Out: 1475 [Urine:1450; Blood:25] Intake/Output this shift: Total I/O In: -  Out: 250 [Urine:250]  PE: General- In NAD Abdomen-soft, dressings dry  Lab Results:   Recent Labs  02/28/16 2249  WBC 14.9*  HGB 15.8*  HCT 47.2*  PLT 278   BMET  Recent Labs  02/28/16 2249  NA 137  K 3.9  CL 103  CO2 17*  GLUCOSE 120*  BUN 8  CREATININE 1.03*  CALCIUM 9.7   PT/INR No results for input(s): LABPROT, INR in the last 72 hours. Comprehensive Metabolic Panel:    Component Value Date/Time   NA 137 02/28/2016 2249   K 3.9 02/28/2016 2249   CL 103 02/28/2016 2249   CO2 17* 02/28/2016 2249   BUN 8 02/28/2016 2249   CREATININE 1.03* 02/28/2016 2249   GLUCOSE 120* 02/28/2016 2249   CALCIUM 9.7 02/28/2016 2249   AST 21 02/28/2016 2249   ALT 19 02/28/2016 2249   ALKPHOS 97 02/28/2016 2249   BILITOT 1.1 02/28/2016 2249   PROT 8.0 02/28/2016 2249   ALBUMIN 4.1 02/28/2016 2249     Studies/Results: Ct Abdomen Pelvis W Contrast  02/29/2016  CLINICAL DATA:  Right lower quadrant abdominal pain. Nausea vomiting and diarrhea, onset yesterday. Leukocytosis. EXAM: CT ABDOMEN AND PELVIS WITH CONTRAST TECHNIQUE: Multidetector CT imaging of the abdomen and pelvis was performed using the standard protocol following bolus administration of intravenous contrast. CONTRAST:  127mL ISOVUE-300 IOPAMIDOL (ISOVUE-300) INJECTION 61% COMPARISON:  CT 10/04/2013  FINDINGS: Lower chest:  The included lung bases are clear. Liver: Tiny hepatic hypodensity at the dome, unchanged. Focal fatty infiltration adjacent with falciform ligament. No suspicious lesion. Hepatobiliary: Gallbladder physiologically distended, no calcified stone. No biliary dilatation. Pancreas: No ductal dilatation or inflammation. Spleen: Normal. Adrenal glands: No nodule. Kidneys: Symmetric renal enhancement and excretion. No hydronephrosis. Stomach/Bowel: Stomach physiologically distended. There are no dilated or thickened small bowel loops. Small volume of stool throughout the colon without colonic wall thickening. Appendix: Dilated and fluid-filled measuring 1.3 cm containing small appendicoliths. Moderate periappendiceal inflammatory change. The appendix courses medial to the cecum in the anterior pelvis. No perforation or abscess. Vascular/Lymphatic: No retroperitoneal adenopathy. Abdominal aorta is normal in caliber. Trace atherosclerosis. Reproductive: Uterus is normal in size. Tampon in place. Prominent left adnexal vascularity and dilatation of the ovarian vein measuring 8 mm. Ovaries symmetric in size. Bladder: Bladder minimally distended. Other: No free air, free fluid, or intra-abdominal fluid collection. Musculoskeletal: There are no acute or suspicious osseous abnormalities. Mild degenerative change in the spine. IMPRESSION: 1. Uncomplicated acute appendicitis. 2. Prominent left adnexal vascularity and dilatation of the ovarian vein, can be seen with pelvic congestion. Electronically Signed   By: Jeb Levering M.D.   On: 02/29/2016 06:40    Anti-infectives: Anti-infectives    Start     Dose/Rate Route Frequency Ordered Stop   03/01/16 1000  cefTRIAXone (ROCEPHIN) 2 g in dextrose 5 % 50 mL IVPB  2 g 100 mL/hr over 30 Minutes Intravenous Every 24 hours 02/29/16 1523     02/29/16 1600  metroNIDAZOLE (FLAGYL) IVPB 500 mg     500 mg 100 mL/hr over 60 Minutes Intravenous Every 8  hours 02/29/16 1523     02/29/16 0730  cefTRIAXone (ROCEPHIN) 1 g in dextrose 5 % 50 mL IVPB     1 g 100 mL/hr over 30 Minutes Intravenous  Once 02/29/16 0720 02/29/16 0907   02/29/16 0730  metroNIDAZOLE (FLAGYL) IVPB 500 mg     500 mg 100 mL/hr over 60 Minutes Intravenous  Once 02/29/16 0720 02/29/16 1026      Assessment Active Problems:   Acute appendicitis with early gangrenous changes s/p laparoscopic appendectomy-doing okay this AM.      Plan: Give AM dose of Rocephin then discharge.  Discharge instructions given to her.   Domanic Matusek J 03/01/2016

## 2016-03-01 NOTE — Discharge Instructions (Signed)
LAPAROSCOPIC APPENDECTOMY SURGERY: POST OP INSTRUCTIONS  1. DIET: Follow a light bland diet the first 24 hours after arrival home, such as soup, liquids, crackers, etc.  Be sure to include lots of fluids daily.  Avoid fast food or heavy meals as your are more likely to get nauseated.  Eat a low fat the next few days after surgery.   2. Take your usually prescribed home medications unless otherwise directed. 3. PAIN CONTROL: a. Pain is best controlled by a usual combination of three different methods TOGETHER: i. Ice/Heat ii. Over the counter pain medication iii. Prescription pain medication b. Most patients will experience some swelling and bruising around the incisions.  Ice packs or heating pads (30-60 minutes up to 6 times a day) will help. Use ice for the first few days to help decrease swelling and bruising, then switch to heat to help relax tight/sore spots and speed recovery.  Some people prefer to use ice alone, heat alone, alternating between ice & heat.  Experiment to what works for you.  Swelling and bruising can take several weeks to resolve.   c. It is helpful to take an over-the-counter pain medication regularly for the first few weeks.  Choose one of the following that works best for you: i. Naproxen (Aleve, etc)  Two 220mg  tabs twice a day ii. Ibuprofen (Advil, etc) Three 200mg  tabs four times a day (every meal & bedtime) iii. Acetaminophen (Tylenol, etc) 500-650mg  four times a day (every meal & bedtime) d. A  prescription for pain medication (such as oxycodone, hydrocodone, etc) should be given to you upon discharge.  Take your pain medication as prescribed.  i. If you are having problems/concerns with the prescription medicine (does not control pain, nausea, vomiting, rash, itching, etc), please call us [phone] to see if we need to switch you to a different pain medicine that will work better for you and/or control your side effect better. ii. If you need a refill on your  pain medication, please contact your pharmacy.  They will contact our office to request authorization. Prescriptions will not be filled after 5 pm or on week-ends. 4. Avoid getting constipated.  Between the surgery and the pain medications, it is common to experience some constipation.  Increasing fluid intake and taking a fiber supplement (such as Metamucil, Citrucel, FiberCon, MiraLax, etc) 1-2 times a day regularly will usually help prevent this problem from occurring.  A mild laxative (prune juice, Milk of Magnesia, MiraLax, etc) should be taken according to package directions if there are no bowel movements after 48 hours.   5. Watch out for diarrhea.  If you have many loose bowel movements, simplify your diet to bland foods & liquids for a few days.  Stop any stool softeners and decrease your fiber supplement.  Switching to mild anti-diarrheal medications (Kayopectate, Pepto Bismol) can help.  If this worsens or does not improve, please call us. 6. Wash / shower every day.  You may shower over the dressings as they are waterproof.  Continue to shower over incision(s) after the dressing is off. 7. Remove your waterproof bandages 3 days after surgery.  You may leave the incision open to air.  You may replace a dressing/Band-Aid to cover the incision for comfort if you wish.  8. ACTIVITIES as tolerated:   a. You may resume regular (light) daily activities beginning the next day--such as daily self-care, walking, climbing stairs--gradually increasing light activities as tolerated.  No heavy lifting (over 10 pounds), straining,  or intense activities for 2 weeks. b. DO NOT PUSH THROUGH PAIN.  Let pain be your guide: If it hurts to do something, don't do it.  Pain is your body warning you to avoid that activity for another week until the pain goes down. c. You may drive when you are no longer taking prescription pain medication, you can comfortably wear a seatbelt, and you can safely maneuver your car and  apply brakes. d. Dennis Bast may have sexual intercourse when it is comfortable.  9. FOLLOW UP in our office a. Please call [facility] at [phone] to set up an appointment to see your surgeon in the office for a follow-up appointment approximately 2-3 weeks after your surgery. b. Make sure that you call for this appointment the day you arrive home to insure a convenient appointment time. 10. IF YOU HAVE DISABILITY OR FAMILY LEAVE FORMS, BRING THEM TO THE OFFICE FOR PROCESSING.  DO NOT GIVE THEM TO YOUR DOCTOR.  11.  Return to work/school:  Desk work/light activities in 5-7 days, full duty/activities in 2 weeks if pain-free.   WHEN TO CALL us [phone]: 1. Poor pain control 2. Reactions / problems with new medications (rash/itching, nausea, etc)  3. Fever over 101.5 F (38.5 C) 4. Inability to urinate 5. Nausea and/or vomiting 6. Worsening swelling or bruising 7. Continued bleeding from incision. 8. Increased pain, redness, or drainage from the incision   The clinic staff is available to answer your questions during regular business hours (8:30am-5pm).  Please dont hesitate to call and ask to speak to one of our nurses for clinical concerns.   If you have a medical emergency, go to the nearest emergency room or call 911.  A surgeon from [facility] is always on call at the [facility], [address], [place], [place], [place]  [address] ? MAIN: [phone] ? TOLL FREE: [phone] ?  FAX [phone] [email or web address]

## 2016-05-05 ENCOUNTER — Emergency Department (HOSPITAL_COMMUNITY): Payer: Self-pay

## 2016-05-05 ENCOUNTER — Encounter (HOSPITAL_COMMUNITY): Payer: Self-pay | Admitting: Emergency Medicine

## 2016-05-05 ENCOUNTER — Emergency Department (HOSPITAL_COMMUNITY)
Admission: EM | Admit: 2016-05-05 | Discharge: 2016-05-06 | Disposition: A | Discharge status: Home / Self Care | Payer: BC Managed Care – PPO | Attending: Emergency Medicine | Admitting: Emergency Medicine

## 2016-05-05 DIAGNOSIS — K6289 Other specified diseases of anus and rectum: Secondary | ICD-10-CM | POA: Insufficient documentation

## 2016-05-05 DIAGNOSIS — F17210 Nicotine dependence, cigarettes, uncomplicated: Secondary | ICD-10-CM | POA: Insufficient documentation

## 2016-05-05 DIAGNOSIS — Z79899 Other long term (current) drug therapy: Secondary | ICD-10-CM | POA: Insufficient documentation

## 2016-05-05 LAB — URINALYSIS, ROUTINE W REFLEX MICROSCOPIC
BILIRUBIN URINE: NEGATIVE
Glucose, UA: NEGATIVE mg/dL
KETONES UR: NEGATIVE mg/dL
NITRITE: POSITIVE — AB
PROTEIN: NEGATIVE mg/dL
SPECIFIC GRAVITY, URINE: 1.013 (ref 1.005–1.030)
pH: 6 (ref 5.0–8.0)

## 2016-05-05 LAB — URINE MICROSCOPIC-ADD ON

## 2016-05-05 LAB — COMPREHENSIVE METABOLIC PANEL
ALBUMIN: 4.2 g/dL (ref 3.5–5.0)
ALT: 11 U/L — ABNORMAL LOW (ref 14–54)
ANION GAP: 10 (ref 5–15)
AST: 15 U/L (ref 15–41)
Alkaline Phosphatase: 105 U/L (ref 38–126)
BILIRUBIN TOTAL: 0.6 mg/dL (ref 0.3–1.2)
BUN: <5 mg/dL — ABNORMAL LOW (ref 6–20)
CHLORIDE: 103 mmol/L (ref 101–111)
CO2: 22 mmol/L (ref 22–32)
Calcium: 9.4 mg/dL (ref 8.9–10.3)
Creatinine, Ser: 0.65 mg/dL (ref 0.44–1.00)
GFR calc Af Amer: >60 mL/min (ref >60)
GFR calc non Af Amer: >60 mL/min (ref >60)
GLUCOSE: 99 mg/dL (ref 65–99)
POTASSIUM: 3.2 mmol/L — AB (ref 3.5–5.1)
SODIUM: 135 mmol/L (ref 135–145)
TOTAL PROTEIN: 7.4 g/dL (ref 6.5–8.1)

## 2016-05-05 LAB — CBC
HEMATOCRIT: 42.6 % (ref 36.0–46.0)
HEMOGLOBIN: 14.1 g/dL (ref 12.0–15.0)
MCH: 30.8 pg (ref 26.0–34.0)
MCHC: 33.1 g/dL (ref 30.0–36.0)
MCV: 93 fL (ref 78.0–100.0)
Platelets: 273 10*3/uL (ref 150–400)
RBC: 4.58 MIL/uL (ref 3.87–5.11)
RDW: 14.7 % (ref 11.5–15.5)
WBC: 11.7 10*3/uL — ABNORMAL HIGH (ref 4.0–10.5)

## 2016-05-05 MED ORDER — DEXTROSE 5 % IV SOLN
1.0000 g | Freq: Once | INTRAVENOUS | Status: AC
  Administered 2016-05-05: 1 g via INTRAVENOUS
  Filled 2016-05-05: qty 10

## 2016-05-05 MED ORDER — OXYCODONE-ACETAMINOPHEN 5-325 MG PO TABS
1.0000 | ORAL_TABLET | ORAL | Status: DC | PRN
  Administered 2016-05-05: 1 via ORAL

## 2016-05-05 MED ORDER — SODIUM CHLORIDE 0.9 % IV BOLUS (SEPSIS)
1000.0000 mL | Freq: Once | INTRAVENOUS | Status: AC
  Administered 2016-05-05: 1000 mL via INTRAVENOUS

## 2016-05-05 MED ORDER — MORPHINE SULFATE (PF) 4 MG/ML IV SOLN
4.0000 mg | Freq: Once | INTRAVENOUS | Status: AC
  Administered 2016-05-05: 4 mg via INTRAVENOUS
  Filled 2016-05-05: qty 1

## 2016-05-05 MED ORDER — OXYCODONE-ACETAMINOPHEN 5-325 MG PO TABS
ORAL_TABLET | ORAL | Status: AC
  Administered 2016-05-05: 1 via ORAL
  Filled 2016-05-05: qty 1

## 2016-05-05 MED ORDER — ONDANSETRON HCL 4 MG/2ML IJ SOLN
4.0000 mg | Freq: Once | INTRAMUSCULAR | Status: AC
  Administered 2016-05-05: 4 mg via INTRAVENOUS
  Filled 2016-05-05: qty 2

## 2016-05-05 MED ORDER — IOPAMIDOL (ISOVUE-300) INJECTION 61%
INTRAVENOUS | Status: AC
  Administered 2016-05-05: 100 mL
  Filled 2016-05-05: qty 100

## 2016-05-05 NOTE — ED Notes (Signed)
Rectal pain started Saturday morning, extremely painful with bowel movements -- hurts with urination-- "feel like my insides are coming out" - pt also states has had a fever since Saturday-- with sore throat.

## 2016-05-05 NOTE — ED Notes (Signed)
Patient did not answer for vital signs reassessment

## 2016-05-06 MED ORDER — LIDOCAINE 2 % EX GEL: 1.0000 "application " | Freq: Three times a day (TID) | CUTANEOUS | Status: DC

## 2016-05-06 MED ORDER — HYDROCORTISONE ACE-PRAMOXINE 1-1 % RE FOAM: 1.0000 | Freq: Two times a day (BID) | RECTAL | Status: DC

## 2016-05-06 MED ORDER — CEPHALEXIN 500 MG PO CAPS: 500.0000 mg | ORAL_CAPSULE | Freq: Four times a day (QID) | ORAL | Status: DC

## 2016-05-06 NOTE — ED Provider Notes (Signed)
CSN: [id number]     Arrival date & time 05/05/16  1047 History   First MD Initiated Contact with Patient 05/05/16 1750     Chief Complaint  Patient presents with  . Rectal Pain     (Consider location/radiation/quality/duration/timing/severity/associated sxs/prior Treatment) HPI...Morgan KitchenMarland KitchenPatient complains of rectal pain since Saturday morning worse with bowel movements. No history of rectal problems. Review systems positive for dysuria. Questionable fever and sore throat. She is normally healthy. Status post recent appendectomy. Severity symptoms is moderate.  History reviewed. No pertinent past medical history. Past Surgical History  Procedure Laterality Date  . Tubal ligation    . Laparoscopic appendectomy N/A 02/29/2016    Procedure: APPENDECTOMY LAPAROSCOPIC;  Surgeon: Morgan Confer, MD;  Location: [facility];  Service: General;  Laterality: N/A;  . Appendectomy     No family history on file. Social History  Substance Use Topics  . Smoking status: Current Every Day Smoker -- 0.50 packs/day    Types: Cigarettes  . Smokeless tobacco: None  . Alcohol Use: Yes     Comment: occasional    OB History    No data available     Review of Systems  All other systems reviewed and are negative.     Allergies  Review of patient's allergies indicates no known allergies.  Home Medications   Prior to Admission medications   Medication Sig Start Date End Date Taking? Authorizing Provider  acetaminophen (TYLENOL) 500 MG tablet Take 500 mg by mouth every 6 (six) hours as needed for mild pain.    Yes Historical Provider, MD  hydrocortisone-pramoxine [facility]) rectal foam Place 1 applicator rectally 2 (two) times daily. 05/06/16   Morgan Christen, MD  oxyCODONE (OXY IR/ROXICODONE) 5 MG immediate release tablet Take 1-2 tablets (5-10 mg total) by mouth every 4 (four) hours as needed for moderate pain, severe pain or breakthrough pain. Patient not taking: Reported on 05/05/2016 03/01/16   Morgan Confer, MD   BP 112/71 mmHg  Pulse 86  Temp(Src) 97.9 F (36.6 C) (Oral)  Resp 16  Ht 5\' 8"  (1.727 m)  Wt 201 lb (91.173 kg)  BMI 30.57 kg/m2  SpO2 98%  LMP 04/11/2016 Physical Exam  Constitutional: She is oriented to person, place, and time. She appears well-developed and well-nourished.  HENT:  Head: Normocephalic and atraumatic.  Eyes: Conjunctivae and EOM are normal. Pupils are equal, round, and reactive to light.  Neck: Normal range of motion. Neck supple.  Cardiovascular: Normal rate and regular rhythm.   Pulmonary/Chest: Effort normal and breath sounds normal.  Abdominal: Soft. Bowel sounds are normal.  Genitourinary:  Rectal exam: There is tenderness and induration surrounding the rectum. I was unable do a digital exam secondary to pain.  Musculoskeletal: Normal range of motion.  Neurological: She is alert and oriented to person, place, and time.  Skin: Skin is warm and dry.  Psychiatric: She has a normal mood and affect. Her behavior is normal.  Nursing note and vitals reviewed.   ED Course  Procedures (including critical care time) Labs Review Labs Reviewed  COMPREHENSIVE METABOLIC PANEL - Abnormal; Notable for the following:    Potassium 3.2 (*)    BUN <5 (*)    ALT 11 (*)    All other components within normal limits  CBC - Abnormal; Notable for the following:    WBC 11.7 (*)    All other components within normal limits  URINALYSIS, ROUTINE W REFLEX MICROSCOPIC (NOT AT [facility]) - Abnormal; Notable for the following:  APPearance CLOUDY (*)    Hgb urine dipstick SMALL (*)    Nitrite POSITIVE (*)    Leukocytes, UA LARGE (*)    All other components within normal limits  URINE MICROSCOPIC-ADD ON - Abnormal; Notable for the following:    Squamous Epithelial / LPF 6-30 (*)    Bacteria, UA MANY (*)    All other components within normal limits  URINE CULTURE    Imaging Review Ct Abdomen Pelvis W Contrast  05/05/2016  CLINICAL DATA:  Rectal pain for  several days, initial encounter EXAM: CT ABDOMEN AND PELVIS WITH CONTRAST TECHNIQUE: Multidetector CT imaging of the abdomen and pelvis was performed using the standard protocol following bolus administration of intravenous contrast. CONTRAST:  142mL ISOVUE-300 IOPAMIDOL (ISOVUE-300) INJECTION 61% COMPARISON:  02/29/2016 FINDINGS: Lung bases are free of acute infiltrate or sizable effusion. The liver again demonstrates a tiny hypodensity likely representing a cyst within the left lobe. The spleen, adrenal glands, gallbladder and pancreas are within normal limits. Kidneys are well visualized and demonstrate a normal enhancement pattern and normal excretion. No renal calculi or obstructive changes are noted. Aortoiliac calcifications are noted. The appendix has been surgically removed. The bladder is well distended. The uterus is within normal limits. Some fluid is noted in the vaginal vault. 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 The large and small bowel are within normal limits. Specifically the rectum is within normal limits. No definitive fluid collection is identified to suggest a perirectal abscess. No acute bony abnormality is noted. IMPRESSION: No acute abnormality is identified. The overall appearance is similar to that seen on the prior exam. Specifically no findings to suggest perirectal abscess are noted. Electronically Signed   By: Inez Catalina M.D.   On: 05/05/2016 20:26   I have personally reviewed and evaluated these images and lab results as part of my medical decision-making.   EKG Interpretation None      MDM   Final diagnoses:  Rectal pain    Patient has tenderness and induration surrounding the rectum. CT scan reveals no obvious perirectal abscess. Urinalysis reveals no evidence of infection. Will start Keflex.  Clinical scenario discussed with general surgeon on-call. They will follow-up in the office tomorrow. Rx Proctofoam [facility]    Morgan Christen, MD 05/06/16  [phone]

## 2016-05-06 NOTE — Discharge Instructions (Signed)
Prescription for rectal suppository. Call [facility] in the morning at [phone].  Please remind them that you were in the emergency department on Monday night and that the ER physician spoke with the surgeon on call. They will hopefully work you into their walk in emergency clinic.  CT scan of abdomen and pelvis was negative.  Additionally you have a urinary tract infection. Prescription for same.

## 2016-05-07 ENCOUNTER — Ambulatory Visit (HOSPITAL_COMMUNITY)
Admission: RE | Admit: 2016-05-07 | Discharge: 2016-05-07 | Disposition: A | Discharge status: Home / Self Care | Payer: Self-pay | Source: Ambulatory Visit | Attending: Physician Assistant | Admitting: Physician Assistant

## 2016-05-07 ENCOUNTER — Other Ambulatory Visit (INDEPENDENT_AMBULATORY_CARE_PROVIDER_SITE_OTHER): Payer: Self-pay | Admitting: Physician Assistant

## 2016-05-07 DIAGNOSIS — K6289 Other specified diseases of anus and rectum: Secondary | ICD-10-CM

## 2016-05-07 MED ORDER — DIATRIZOATE MEGLUMINE & SODIUM 66-10 % PO SOLN
30.0000 mL | Freq: Once | ORAL | Status: AC
  Administered 2016-05-07: 30 mL via ORAL

## 2016-05-07 MED ORDER — IOPAMIDOL (ISOVUE-300) INJECTION 61%
100.0000 mL | Freq: Once | INTRAVENOUS | Status: AC | PRN
  Administered 2016-05-07: 100 mL via INTRAVENOUS

## 2016-05-08 LAB — URINE CULTURE

## 2016-05-09 ENCOUNTER — Telehealth (HOSPITAL_BASED_OUTPATIENT_CLINIC_OR_DEPARTMENT_OTHER): Payer: Self-pay

## 2016-05-09 NOTE — Telephone Encounter (Signed)
Post ED Visit - Positive Culture Follow-up  Culture report reviewed by antimicrobial stewardship pharmacist:  []  Elenor Quinones, Pharm.D. []  Heide Guile, Pharm.D., BCPS []  Parks Neptune, Pharm.D. []  Alycia Rossetti, Pharm.D., BCPS [x]  [place], [place].D., BCPS, AAHIVP []  Legrand Como, Pharm.D., BCPS, AAHIVP []  Milus Glazier, Pharm.D. []  Rob Evette Doffing, Pharm.D.  Positive urine culture Treated with Cephalexin, organism sensitive to the same and no further patient follow-up is required at this time.  Genia Del 05/09/2016, 10:31 AM

## 2017-01-16 ENCOUNTER — Emergency Department (HOSPITAL_COMMUNITY): Payer: Self-pay

## 2017-01-16 ENCOUNTER — Other Ambulatory Visit: Payer: Self-pay

## 2017-01-16 ENCOUNTER — Emergency Department (HOSPITAL_COMMUNITY)
Admission: EM | Admit: 2017-01-16 | Discharge: 2017-01-16 | Disposition: A | Discharge status: Home / Self Care | Payer: Self-pay | Attending: Emergency Medicine | Admitting: Emergency Medicine

## 2017-01-16 ENCOUNTER — Encounter (HOSPITAL_COMMUNITY): Payer: Self-pay | Admitting: *Deleted

## 2017-01-16 DIAGNOSIS — Y939 Activity, unspecified: Secondary | ICD-10-CM | POA: Insufficient documentation

## 2017-01-16 DIAGNOSIS — F17210 Nicotine dependence, cigarettes, uncomplicated: Secondary | ICD-10-CM | POA: Insufficient documentation

## 2017-01-16 DIAGNOSIS — Y929 Unspecified place or not applicable: Secondary | ICD-10-CM | POA: Insufficient documentation

## 2017-01-16 DIAGNOSIS — Y999 Unspecified external cause status: Secondary | ICD-10-CM | POA: Insufficient documentation

## 2017-01-16 DIAGNOSIS — S0181XA Laceration without foreign body of other part of head, initial encounter: Secondary | ICD-10-CM | POA: Insufficient documentation

## 2017-01-16 DIAGNOSIS — S0990XA Unspecified injury of head, initial encounter: Secondary | ICD-10-CM

## 2017-01-16 DIAGNOSIS — Z79899 Other long term (current) drug therapy: Secondary | ICD-10-CM | POA: Insufficient documentation

## 2017-01-16 DIAGNOSIS — W010XXA Fall on same level from slipping, tripping and stumbling without subsequent striking against object, initial encounter: Secondary | ICD-10-CM | POA: Insufficient documentation

## 2017-01-16 LAB — URINALYSIS, ROUTINE W REFLEX MICROSCOPIC
BACTERIA UA: NONE SEEN
Bilirubin Urine: NEGATIVE
Glucose, UA: NEGATIVE mg/dL
Hgb urine dipstick: NEGATIVE
Ketones, ur: NEGATIVE mg/dL
NITRITE: NEGATIVE
PROTEIN: NEGATIVE mg/dL
SPECIFIC GRAVITY, URINE: 1.008 (ref 1.005–1.030)
pH: 5 (ref 5.0–8.0)

## 2017-01-16 NOTE — ED Provider Notes (Signed)
[facility] DEPT Provider Note   CSN: [id number] Arrival date & time: 01/16/17  1605     History   Chief Complaint Chief Complaint  Patient presents with  . Dizziness  . Nausea    HPI Morgan Orozco is a 46 y.o. female.  The history is provided by the patient and medical records. No language interpreter was used.    Morgan Orozco is an otherwise healthy 46 y.o. female  who presents to the Emergency Department complaining of headache 6 days. Patient states that she slipped on her brother's hardwood floor and fell on her face 6 days ago. No LOC.  Headache is described as intermittent aching across her forehead as well as behind her right eye. When she awoke the next morning she had bruising underneath bilateral eyes. She also endorses right-sided and forehead facial swelling. She used ice with some relief in swelling. She has tried Tylenol and ibuprofen with adequate relief in pain.She has been nauseous for the last 6 days, one episode of emesis 5 days ago. Tolerating PO well. She endorses bilateral visual changes described as floaters which have now resolved.  Her tetanus is up-to-date.   History reviewed. No pertinent past medical history.  Patient Active Problem List   Diagnosis Date Noted  . Acute appendicitis 02/29/2016    Past Surgical History:  Procedure Laterality Date  . APPENDECTOMY    . LAPAROSCOPIC APPENDECTOMY N/A 02/29/2016   Procedure: APPENDECTOMY LAPAROSCOPIC;  Surgeon: Jackolyn Confer, MD;  Location: [facility];  Service: General;  Laterality: N/A;  . TUBAL LIGATION      OB History    No data available       Home Medications    Prior to Admission medications   Medication Sig Start Date End Date Taking? Authorizing Provider  acetaminophen (TYLENOL) 650 MG CR tablet Take 1,300 mg by mouth every 8 (eight) hours as needed for pain.   Yes Historical Provider, MD    Family History No family history on file.  Social History Social History  Substance Use  Topics  . Smoking status: Current Every Day Smoker    Packs/day: 1.00    Types: Cigarettes  . Smokeless tobacco: Never Used  . Alcohol use Yes     Comment: occasional      Allergies   Patient has no known allergies.   Review of Systems Review of Systems  Constitutional: Negative for chills and fever.  HENT: Positive for facial swelling. Negative for congestion, nosebleeds and trouble swallowing.   Eyes: Negative for visual disturbance.  Respiratory: Negative for cough and shortness of breath.   Cardiovascular: Negative for chest pain.  Gastrointestinal: Positive for nausea and vomiting. Negative for abdominal pain.  Genitourinary: Negative for dysuria.  Musculoskeletal: Positive for myalgias.  Skin: Positive for wound.  Neurological: Positive for headaches. Negative for dizziness, syncope and weakness.     Physical Exam Updated Vital Signs BP 116/72 (BP Location: Right Arm)   Pulse 75   Temp 98.4 F (36.9 C) (Oral)   Resp 20   Ht 5\' 8"  (1.727 m)   Wt 81.6 kg   LMP 01/09/2017   SpO2 100%   BMI 27.37 kg/m   Physical Exam  Constitutional: She is oriented to person, place, and time. She appears well-developed and well-nourished. No distress.  HENT:  Head: Normocephalic and atraumatic. Head is without Battle's sign.  Right Ear: Tympanic membrane normal. No hemotympanum.  Left Ear: Tympanic membrane normal. No hemotympanum.  Nose: Nose normal.  Bruising under right eye with mild periorbital swelling. No tenderness to palpation of the nose, forehead or maxillary region. 1 cm circular scabbed over laceration to the top center of forehead with no surrounding erythema.   Neck:  No midline tenderness.  Cardiovascular: Normal rate, regular rhythm and normal heart sounds.   No murmur heard. Pulmonary/Chest: Effort normal and breath sounds normal. No respiratory distress.  Abdominal: Soft. She exhibits no distension. There is no tenderness.  Musculoskeletal: She exhibits no  edema.  Neurological: She is alert and oriented to person, place, and time.  Alert, oriented, thought content appropriate, able to give a coherent history. Speech is clear and goal oriented, able to follow commands.  Cranial Nerves:  II:  Peripheral visual fields grossly normal, pupils equal, round, reactive to light III, IV, VI: EOM intact bilaterally, ptosis not present V,VII: smile symmetric, eyes kept closed tightly against resistance, facial light touch sensation equal VIII: hearing grossly normal IX, X: symmetric soft palate movement, uvula elevates symmetrically  XI: bilateral shoulder shrug symmetric and strong XII: midline tongue extension 5/5 muscle strength in upper and lower extremities bilaterally including strong and equal grip strength and dorsiflexion/plantar flexion Sensory to light touch normal in all four extremities.  Normal finger-to-nose and rapid alternating movements; normal gait and balance. Negative romberg, no pronator drift.  Skin: Skin is warm and dry.  Nursing note and vitals reviewed.    ED Treatments / Results  Labs (all labs ordered are listed, but only abnormal results are displayed) Labs Reviewed  URINALYSIS, ROUTINE W REFLEX MICROSCOPIC - Abnormal; Notable for the following:       Result Value   Color, Urine STRAW (*)    Leukocytes, UA SMALL (*)    Squamous Epithelial / LPF 0-5 (*)    All other components within normal limits    EKG  EKG Interpretation None       Radiology Ct Maxillofacial Wo Contrast  Result Date: 01/16/2017 CLINICAL DATA:  Fall and landed base down headaches since the fall abrasions over the forehead EXAM: CT MAXILLOFACIAL WITHOUT CONTRAST TECHNIQUE: Multidetector CT imaging of the maxillofacial structures was performed. Multiplanar CT image reconstructions were also generated. A small metallic BB was placed on the right temple in order to reliably differentiate right from left. COMPARISON:  None. FINDINGS: Osseous: No  evidence for nasal bone fracture. Mandibular heads appear normally positioned. Mandible is intact. Prominent root lucency around the bilateral mandibular molar teeth. Pterygoid plates and zygomatic arches are without fracture. Orbits: No orbital wall fracture. Globes within normal limits. No intra or extraconal soft tissue abnormality Sinuses: Mucosal thickening within the maxillary sinuses. Small fluid level left sphenoid sinus, no definite central skullbase lucency. Moderate opacification of the ethmoid and frontal sinuses. No sinus wall fracture Soft tissues: Soft tissue swelling over the forehead. Limited intracranial: No significant or unexpected finding. IMPRESSION: 1. No definite acute facial bone fracture. 2. Soft tissue swelling over the forehead 3. Fluid level in the sphenoid sinus, no central skullbase lucency/fracture identified 4. Pan sinusitis Electronically Signed   By: Donavan Foil M.D.   On: 01/16/2017 18:54    Procedures Procedures (including critical care time)  Medications Ordered in ED Medications - No data to display   Initial Impression / Assessment and Plan / ED Course  I have reviewed the triage vital signs and the nursing notes.  Pertinent labs & imaging results that were available during my care of the patient were reviewed by me and considered in my  medical decision making (see chart for details).    Morgan Orozco is a 46 y.o. female who presents to ED for persistent headache x 6 days after head injury 2/2 mechanical fall. No focal neuro deficits on exam. She does have bruising under her right eye. Compared to photos taken by patient at time of injury, bruising appears very much improved. She is concerned for fracture - CT maxillofacial obtain which is reassuring with no acute facial bone fractures. I discussed option of obtain CT head as well. Patient very much concerned over financial burden of CT imaging. I discussed risks and benefits of obtaining CT head and patient  decided to hold on CT head. Appears to be healing well and appropriate with likely post-concussive symptoms. Offered zofran for nausea which patient declined. Symptomatic home care including ice, nsaids, rest discussed with patient. PCP follow up if symptoms do not improve. Return precautions discussed and all questions answered.    Final Clinical Impressions(s) / ED Diagnoses   Final diagnoses:  Injury of head, initial encounter    New Prescriptions New Prescriptions   No medications on file     [facility], PA-C 01/16/17 [place], MD 01/18/17 1323

## 2017-01-16 NOTE — Discharge Instructions (Signed)
Follow up with your primary care provider if symptoms do not improve in 4-5 days.  Return to ER for new or worsening symptoms, any additional concerns.

## 2017-01-16 NOTE — ED Notes (Signed)
Patient transported to CT 

## 2017-01-16 NOTE — ED Triage Notes (Addendum)
Pt reports fall Saturday, landed face down on a hard wood floor.  Pt reports nausea, dizziness, seeing spots and h/a since the fall.  She was not seen at the time of the fall.  Pt reports vomited a lot Sunday, none today.  Pt is A&Ox 4.  No obvious neuro deficits noted.  Denies blood thinner use.  Abrasion noted on her forehead.

## 2017-01-16 NOTE — ED Notes (Signed)
2 IV start attempts by RN 

## 2017-03-25 ENCOUNTER — Emergency Department (HOSPITAL_COMMUNITY): Payer: Self-pay

## 2017-03-25 ENCOUNTER — Emergency Department (HOSPITAL_COMMUNITY)
Admission: EM | Admit: 2017-03-25 | Discharge: 2017-03-25 | Disposition: A | Discharge status: Home / Self Care | Payer: Self-pay | Attending: Emergency Medicine | Admitting: Emergency Medicine

## 2017-03-25 ENCOUNTER — Encounter (HOSPITAL_COMMUNITY): Payer: Self-pay

## 2017-03-25 DIAGNOSIS — J209 Acute bronchitis, unspecified: Secondary | ICD-10-CM | POA: Insufficient documentation

## 2017-03-25 DIAGNOSIS — F17210 Nicotine dependence, cigarettes, uncomplicated: Secondary | ICD-10-CM | POA: Insufficient documentation

## 2017-03-25 DIAGNOSIS — Z79899 Other long term (current) drug therapy: Secondary | ICD-10-CM | POA: Insufficient documentation

## 2017-03-25 LAB — CBC WITH DIFFERENTIAL/PLATELET
Basophils Absolute: 0 10*3/uL (ref 0.0–0.1)
Basophils Relative: 0 %
Eosinophils Absolute: 0 10*3/uL (ref 0.0–0.7)
Eosinophils Relative: 0 %
HCT: 43.4 % (ref 36.0–46.0)
Hemoglobin: 15 g/dL (ref 12.0–15.0)
Lymphocytes Relative: 15 %
Lymphs Abs: 1.8 10*3/uL (ref 0.7–4.0)
MCH: 32.5 pg (ref 26.0–34.0)
MCHC: 34.6 g/dL (ref 30.0–36.0)
MCV: 93.9 fL (ref 78.0–100.0)
Monocytes Absolute: 0.7 10*3/uL (ref 0.1–1.0)
Monocytes Relative: 6 %
Neutro Abs: 9.5 10*3/uL — ABNORMAL HIGH (ref 1.7–7.7)
Neutrophils Relative %: 79 %
Platelets: 287 10*3/uL (ref 150–400)
RBC: 4.62 MIL/uL (ref 3.87–5.11)
RDW: 12.6 % (ref 11.5–15.5)
WBC: 12 10*3/uL — ABNORMAL HIGH (ref 4.0–10.5)

## 2017-03-25 LAB — BASIC METABOLIC PANEL WITH GFR
Anion gap: 10 (ref 5–15)
BUN: 12 mg/dL (ref 6–20)
CO2: 23 mmol/L (ref 22–32)
Calcium: 9.2 mg/dL (ref 8.9–10.3)
Chloride: 103 mmol/L (ref 101–111)
Creatinine, Ser: 0.7 mg/dL (ref 0.44–1.00)
GFR calc Af Amer: >60 mL/min
GFR calc non Af Amer: >60 mL/min
Glucose, Bld: 97 mg/dL (ref 65–99)
Potassium: 3.4 mmol/L — ABNORMAL LOW (ref 3.5–5.1)
Sodium: 136 mmol/L (ref 135–145)

## 2017-03-25 MED ORDER — AZITHROMYCIN 250 MG PO TABS
250.0000 mg | ORAL_TABLET | Freq: Every day | ORAL | 0 refills | Status: DC
Start: 2017-03-25 — End: 2017-08-20

## 2017-03-25 NOTE — ED Triage Notes (Signed)
Patient c/o nasal congestion, intermittent fever, and a non productive cough x 10 days.

## 2017-03-25 NOTE — ED Notes (Signed)
Discharge instructions and follow up care reviewed with patient. Patient verbalized understanding. 

## 2017-03-25 NOTE — ED Notes (Signed)
Patient transported to X-ray 

## 2017-03-25 NOTE — ED Provider Notes (Signed)
[place] [facility] DEPT Provider Note   CSN: [id number] Arrival date & time: 03/25/17  1019     History   Chief Complaint Chief Complaint  Patient presents with  . Cough  . Fever  . Nasal Congestion    HPI Morgan Orozco is a 46 y.o. female.  The history is provided by the patient. No language interpreter was used.  Cough   Fever   Associated symptoms include cough.    Morgan Orozco is a 46 y.o. female who presents to the Emergency Department complaining of cough, fever.  She reports two weeks of nasal and chest congestion with associated fatigue, malaise and body aches.  She has associated intermittent fevers, last fever was yesterday to 101.7.  She has mild diarrhea, sore throat.  No abdominal pain, nausea, vomiting, dysuria. Symptoms are moderate, constant, worsening.  History reviewed. No pertinent past medical history.  Patient Active Problem List   Diagnosis Date Noted  . Acute appendicitis 02/29/2016    Past Surgical History:  Procedure Laterality Date  . APPENDECTOMY    . LAPAROSCOPIC APPENDECTOMY N/A 02/29/2016   Procedure: APPENDECTOMY LAPAROSCOPIC;  Surgeon: Jackolyn Confer, MD;  Location: [facility];  Service: General;  Laterality: N/A;  . TUBAL LIGATION      OB History    No data available       Home Medications    Prior to Admission medications   Medication Sig Start Date End Date Taking? Authorizing Provider  acetaminophen (TYLENOL) 500 MG tablet Take 1,000 mg by mouth every 6 (six) hours as needed for mild pain, moderate pain, fever or headache.   Yes Historical Provider, MD  azithromycin (ZITHROMAX) 250 MG tablet Take 1 tablet (250 mg total) by mouth daily. Take first 2 tablets together, then 1 every day until finished. 03/25/17   Quintella Reichert, MD    Family History History reviewed. No pertinent family history.  Social History Social History  Substance Use Topics  . Smoking status: Current Every Day Smoker    Packs/day: 1.00    Types: Cigarettes  .  Smokeless tobacco: Never Used  . Alcohol use Yes     Comment: occasional      Allergies   Patient has no known allergies.   Review of Systems Review of Systems  Constitutional: Positive for fever.  Respiratory: Positive for cough.   All other systems reviewed and are negative.    Physical Exam Updated Vital Signs BP 134/90 (BP Location: Right Arm)   Pulse (!) 103   Temp 97.9 F (36.6 C) (Oral)   Resp 18   Ht 5\' 8"  (1.727 m)   Wt 175 lb (79.4 kg)   LMP 02/23/2017   SpO2 100%   BMI 26.61 kg/m   Physical Exam  Constitutional: She is oriented to person, place, and time. She appears well-developed and well-nourished.  HENT:  Head: Normocephalic and atraumatic.  Mouth/Throat: Oropharynx is clear and moist.  Neck: Neck supple.  Cardiovascular: Regular rhythm.   No murmur heard. Tachycardic  Pulmonary/Chest: Effort normal and breath sounds normal. No respiratory distress.  Abdominal: Soft. There is no tenderness. There is no rebound and no guarding.  Musculoskeletal: She exhibits no edema or tenderness.  Neurological: She is alert and oriented to person, place, and time.  Skin: Skin is warm and dry.  Psychiatric: She has a normal mood and affect. Her behavior is normal.  Nursing note and vitals reviewed.    ED Treatments / Results  Labs (all labs ordered are  listed, but only abnormal results are displayed) Labs Reviewed  BASIC METABOLIC PANEL - Abnormal; Notable for the following:       Result Value   Potassium 3.4 (*)    All other components within normal limits  CBC WITH DIFFERENTIAL/PLATELET - Abnormal; Notable for the following:    WBC 12.0 (*)    Neutro Abs 9.5 (*)    All other components within normal limits    EKG  EKG Interpretation None       Radiology Dg Chest 2 View  Result Date: 03/25/2017 CLINICAL DATA:  Fever and cough EXAM: CHEST  2 VIEW COMPARISON:  None FINDINGS: The heart size and mediastinal contours are within normal limits. Both  lungs are clear. Mild curvature the lumbar spine is convex towards the right. IMPRESSION: No active cardiopulmonary disease. Electronically Signed   By: Kerby Moors M.D.   On: 03/25/2017 11:07    Procedures Procedures (including critical care time)  Medications Ordered in ED Medications - No data to display   Initial Impression / Assessment and Plan / ED Course  I have reviewed the triage vital signs and the nursing notes.  Pertinent labs & imaging results that were available during my care of the patient were reviewed by me and considered in my medical decision making (see chart for details).     Patient here for evaluation of 2 weeks of progressive congestion with associated cough and fevers. She is nontoxic appearing on examination but no respiratory distress. Presentation is not consistent with PE, sepsis. Concern for acute bronchitis, will cover for possible bacterial cause of bronchitis given the duration of fevers. Discussed outpatient follow-up, home care and return precautions.  Final Clinical Impressions(s) / ED Diagnoses   Final diagnoses:  Acute bronchitis, unspecified organism    New Prescriptions New Prescriptions   AZITHROMYCIN (ZITHROMAX) 250 MG TABLET    Take 1 tablet (250 mg total) by mouth daily. Take first 2 tablets together, then 1 every day until finished.     Quintella Reichert, MD 03/25/17 1131

## 2017-08-19 ENCOUNTER — Emergency Department (HOSPITAL_COMMUNITY)
Admission: EM | Admit: 2017-08-19 | Discharge: 2017-08-20 | Discharge status: Against Medical Advice | Payer: Self-pay | Attending: Emergency Medicine | Admitting: Emergency Medicine

## 2017-08-19 DIAGNOSIS — Z79899 Other long term (current) drug therapy: Secondary | ICD-10-CM | POA: Insufficient documentation

## 2017-08-19 DIAGNOSIS — R109 Unspecified abdominal pain: Secondary | ICD-10-CM

## 2017-08-19 DIAGNOSIS — F17210 Nicotine dependence, cigarettes, uncomplicated: Secondary | ICD-10-CM | POA: Insufficient documentation

## 2017-08-19 DIAGNOSIS — R197 Diarrhea, unspecified: Secondary | ICD-10-CM | POA: Insufficient documentation

## 2017-08-19 DIAGNOSIS — R1031 Right lower quadrant pain: Secondary | ICD-10-CM | POA: Insufficient documentation

## 2017-08-19 NOTE — ED Notes (Signed)
Pt called from lobby with no response. 

## 2017-08-20 ENCOUNTER — Encounter (HOSPITAL_COMMUNITY): Payer: Self-pay | Admitting: Emergency Medicine

## 2017-08-20 ENCOUNTER — Emergency Department (HOSPITAL_COMMUNITY): Payer: Self-pay

## 2017-08-20 LAB — URINALYSIS, ROUTINE W REFLEX MICROSCOPIC
Bilirubin Urine: NEGATIVE
GLUCOSE, UA: NEGATIVE mg/dL
KETONES UR: NEGATIVE mg/dL
Nitrite: NEGATIVE
PROTEIN: NEGATIVE mg/dL
Specific Gravity, Urine: 1.017 (ref 1.005–1.030)
pH: 5 (ref 5.0–8.0)

## 2017-08-20 LAB — CBC WITH DIFFERENTIAL/PLATELET
BASOS ABS: 0 10*3/uL (ref 0.0–0.1)
BASOS PCT: 0 %
Eosinophils Absolute: 0.3 10*3/uL (ref 0.0–0.7)
Eosinophils Relative: 3 %
HEMATOCRIT: 43.7 % (ref 36.0–46.0)
HEMOGLOBIN: 15 g/dL (ref 12.0–15.0)
LYMPHS PCT: 45 %
Lymphs Abs: 3.7 10*3/uL (ref 0.7–4.0)
MCH: 31.9 pg (ref 26.0–34.0)
MCHC: 34.3 g/dL (ref 30.0–36.0)
MCV: 93 fL (ref 78.0–100.0)
Monocytes Absolute: 0.7 10*3/uL (ref 0.1–1.0)
Monocytes Relative: 9 %
NEUTROS ABS: 3.5 10*3/uL (ref 1.7–7.7)
NEUTROS PCT: 43 %
Platelets: 286 10*3/uL (ref 150–400)
RBC: 4.7 MIL/uL (ref 3.87–5.11)
RDW: 12.9 % (ref 11.5–15.5)
WBC: 8.2 10*3/uL (ref 4.0–10.5)

## 2017-08-20 LAB — COMPREHENSIVE METABOLIC PANEL
ALBUMIN: 4.4 g/dL (ref 3.5–5.0)
ALT: 13 U/L — AB (ref 14–54)
AST: 17 U/L (ref 15–41)
Alkaline Phosphatase: 94 U/L (ref 38–126)
Anion gap: 9 (ref 5–15)
BILIRUBIN TOTAL: 0.6 mg/dL (ref 0.3–1.2)
BUN: 12 mg/dL (ref 6–20)
CHLORIDE: 106 mmol/L (ref 101–111)
CO2: 24 mmol/L (ref 22–32)
CREATININE: 0.79 mg/dL (ref 0.44–1.00)
Calcium: 9.3 mg/dL (ref 8.9–10.3)
GFR calc Af Amer: >60 mL/min (ref >60)
GLUCOSE: 96 mg/dL (ref 65–99)
POTASSIUM: 3.5 mmol/L (ref 3.5–5.1)
Sodium: 139 mmol/L (ref 135–145)
Total Protein: 7.5 g/dL (ref 6.5–8.1)

## 2017-08-20 LAB — LIPASE, BLOOD: LIPASE: 32 U/L (ref 11–51)

## 2017-08-20 LAB — I-STAT BETA HCG BLOOD, ED (MC, WL, AP ONLY)

## 2017-08-20 LAB — POC OCCULT BLOOD, ED: FECAL OCCULT BLD: NEGATIVE

## 2017-08-20 MED ORDER — TRAMADOL HCL 50 MG PO TABS: 50.0000 mg | ORAL_TABLET | Freq: Four times a day (QID) | ORAL | 0 refills | Status: DC | PRN

## 2017-08-20 MED ORDER — NITROFURANTOIN MONOHYD MACRO 100 MG PO CAPS: 100.0000 mg | ORAL_CAPSULE | Freq: Two times a day (BID) | ORAL | 0 refills | Status: DC

## 2017-08-20 MED ORDER — KETOROLAC TROMETHAMINE 30 MG/ML IJ SOLN
30.0000 mg | Freq: Once | INTRAMUSCULAR | Status: AC
  Administered 2017-08-20: 30 mg via INTRAVENOUS
  Filled 2017-08-20: qty 1

## 2017-08-20 MED ORDER — NITROFURANTOIN MONOHYD MACRO 100 MG PO CAPS
100.0000 mg | ORAL_CAPSULE | Freq: Once | ORAL | Status: AC
  Administered 2017-08-20: 100 mg via ORAL
  Filled 2017-08-20: qty 1

## 2017-08-20 MED ORDER — SODIUM CHLORIDE 0.9 % IV BOLUS (SEPSIS)
1000.0000 mL | Freq: Once | INTRAVENOUS | Status: AC
  Administered 2017-08-20: 1000 mL via INTRAVENOUS

## 2017-08-20 MED ORDER — MORPHINE SULFATE (PF) 4 MG/ML IV SOLN
4.0000 mg | Freq: Once | INTRAVENOUS | Status: AC
  Administered 2017-08-20: 4 mg via INTRAVENOUS
  Filled 2017-08-20: qty 1

## 2017-08-20 NOTE — ED Notes (Signed)
Bed: [facility] Expected date:  Expected time:  Means of arrival:  Comments: 

## 2017-08-20 NOTE — ED Provider Notes (Signed)
[place] DEPT Provider Note   CSN: [id number] Arrival date & time: 08/19/17  1535     History   Chief Complaint Chief Complaint  Patient presents with  . Flank Pain  . Diarrhea    HPI Morgan Orozco is a 46 y.o. female.  The history is provided by the patient.  She complains of right lower abdominal pain and right flank pain which started about 16 hours ago. She rates pain at 8/10. Nothing makes it better, nothing makes it worse. She had diarrhea which started 2 days ago. She describes the diarrhea as explosive and liquid and black. Diarrhea has slowed down, and she has only had one bowel movement a last 12 hours. She denies fever or chills but has had some sweats. She did have some nausea but no vomiting. She denies urinary urgency, frequency, tenesmus, dysuria. She denies any sick contacts.  History reviewed. No pertinent past medical history.  Patient Active Problem List   Diagnosis Date Noted  . Acute appendicitis 02/29/2016    Past Surgical History:  Procedure Laterality Date  . APPENDECTOMY    . LAPAROSCOPIC APPENDECTOMY N/A 02/29/2016   Procedure: APPENDECTOMY LAPAROSCOPIC;  Surgeon: Jackolyn Confer, MD;  Location: [facility];  Service: General;  Laterality: N/A;  . TUBAL LIGATION      OB History    No data available       Home Medications    Prior to Admission medications   Medication Sig Start Date End Date Taking? Authorizing Provider  acetaminophen (TYLENOL) 500 MG tablet Take 1,000 mg by mouth every 6 (six) hours as needed for mild pain, moderate pain, fever or headache.    [provider]  azithromycin (ZITHROMAX) 250 MG tablet Take 1 tablet (250 mg total) by mouth daily. Take first 2 tablets together, then 1 every day until finished. 03/25/17   Quintella Reichert, MD    Family History History reviewed. No pertinent family history.  Social History Social History  Substance Use Topics  . Smoking status: Current Every Day Smoker    Packs/day:  1.00    Types: Cigarettes  . Smokeless tobacco: Never Used  . Alcohol use Yes     Comment: occasional      Allergies   Patient has no known allergies.   Review of Systems Review of Systems  All other systems reviewed and are negative.    Physical Exam Updated Vital Signs BP 128/78 (BP Location: Left Arm)   Pulse 94   Temp 98.3 F (36.8 C) (Oral)   Resp 18   Wt 84.4 kg (186 lb)   LMP 08/12/2017 (Exact Date)   SpO2 100%   BMI 28.28 kg/m   Physical Exam  Nursing note and vitals reviewed.  46 year old female, resting comfortably and in no acute distress. Vital signs are normal. Oxygen saturation is 100%, which is normal. Head is normocephalic and atraumatic. PERRLA, EOMI. Oropharynx is clear. Neck is nontender and supple without adenopathy or JVD. Back is nontender in the midline. There is mild right CVA tenderness. Lungs are clear without rales, wheezes, or rhonchi. Chest is nontender. Heart has regular rate and rhythm without murmur. Abdomen is soft, flat, with mild right lower quadrant tenderness. There is no rebound or guarding. There are no masses or hepatosplenomegaly and peristalsis is hypoactive. Rectal: Normal sphincter tone No visible stool in the rectal ampulla. Extremities have no cyanosis or edema, full range of motion is present. Skin is warm and dry without rash. Neurologic: Mental  status is normal, cranial nerves are intact, there are no motor or sensory deficits.  ED Treatments / Results  Labs (all labs ordered are listed, but only abnormal results are displayed) Labs Reviewed  COMPREHENSIVE METABOLIC PANEL - Abnormal; Notable for the following:       Result Value   ALT 13 (*)    All other components within normal limits  URINALYSIS, ROUTINE W REFLEX MICROSCOPIC - Abnormal; Notable for the following:    APPearance HAZY (*)    Hgb urine dipstick SMALL (*)    Leukocytes, UA LARGE (*)    Bacteria, UA RARE (*)    Squamous Epithelial / LPF 6-30 (*)     All other components within normal limits  LIPASE, BLOOD  CBC WITH DIFFERENTIAL/PLATELET  I-STAT BETA HCG BLOOD, ED ([facility], [facility], [facility] ONLY)  POC OCCULT BLOOD, ED     Radiology Ct Renal Stone Study  Result Date: 08/20/2017 CLINICAL DATA:  Flank pain, stone disease suspected. EXAM: CT ABDOMEN AND PELVIS WITHOUT CONTRAST TECHNIQUE: Multidetector CT imaging of the abdomen and pelvis was performed following the standard protocol without IV contrast. COMPARISON:  CT 05/07/2016 FINDINGS: Lower chest: The lung bases are clear. Hepatobiliary: No focal hepatic lesion allowing for lack contrast. Gallbladder physiologically distended, no calcified stone. No biliary dilatation. Pancreas: No ductal dilatation or inflammation. Spleen: Normal in size without focal abnormality. Adrenals/Urinary Tract: Normal adrenal glands. No hydronephrosis or perinephric edema. Calcification in the upper right kidney may be parenchymal, less likely nonobstructing stone. Both ureters are decompressed without stones along the course. Urinary bladder is minimally distended without bladder stone. Stomach/Bowel: Stomach is within normal limits. Appendix is surgically absent. No evidence of bowel wall thickening, distention, or inflammatory changes. Vascular/Lymphatic: No adenopathy. Normal caliber abdominal aorta which trace atherosclerosis. Reproductive: Uterus and bilateral adnexa are unremarkable. Other: No free air, free fluid, or intra-abdominal fluid collection. Musculoskeletal: There are no acute or suspicious osseous abnormalities. IMPRESSION: 1. No hydronephrosis or obstructive uropathy. Possible punctate right renal stone versus parenchymal calcification. 2. Mild aortic atherosclerosis. Electronically Signed   By: Jeb Levering M.D.   On: 08/20/2017 04:17    Procedures Procedures (including critical care time)  Medications Ordered in ED Medications  sodium chloride 0.9 % bolus 1,000 mL (0 mLs Intravenous Stopped 08/20/17  0526)  ketorolac (TORADOL) 30 MG/ML injection 30 mg (30 mg Intravenous Given 08/20/17 0336)  morphine 4 MG/ML injection 4 mg (4 mg Intravenous Given 08/20/17 0523)  nitrofurantoin (macrocrystal-monohydrate) (MACROBID) capsule 100 mg (100 mg Oral Given 08/20/17 0523)     Initial Impression / Assessment and Plan / ED Course  I have reviewed the triage vital signs and the nursing notes.  Pertinent labs & imaging results that were available during my care of the patient were reviewed by me and considered in my medical decision making (see chart for details).  Diarrhea which has resolved.  Vital enteritis. No red flags to suggest more serious illness. Report of black stool, but no obvious stool on rectal exam.right flank pain-consider urolithiasis, consider UTI. She'll be sent for renal stone protocol CT scan. Will also give IV fluids. Old records are reviewed, and she has no relevant past visits.  CT shows no evidence of ureteral calculi, and is unremarkable. She did not get any relief of pain with ketorolac. She's given morphine with good relief of pain. She's not had any further diarrhea. Urinalysis shows possible evidence of infection, but this is not clear. However, that seems to be the most  likely cause for her flank pain. She is discharged with prescription for nitrofurantoin and that, all, follow-up with PCP. Return precautions discussed.  Final Clinical Impressions(s) / ED Diagnoses   Final diagnoses:  Right flank pain  Diarrhea of presumed infectious origin    New Prescriptions New Prescriptions   NITROFURANTOIN, MACROCRYSTAL-MONOHYDRATE, (MACROBID) 100 MG CAPSULE    Take 1 capsule (100 mg total) by mouth 2 (two) times daily. X 7 days   TRAMADOL (ULTRAM) 50 MG TABLET    Take 1 tablet (50 mg total) by mouth every 6 (six) hours as needed.     Delora Fuel, MD 71/95/97 [id number]

## 2017-08-20 NOTE — Discharge Instructions (Signed)
If diarrhea comes back, take loperamide (Imodium AD) as needed.  Return if pain is getting worse, or if you start running a fever.

## 2017-08-20 NOTE — ED Triage Notes (Signed)
Pt states early Monday morning she woke up with explosive diarrhea  Pt states Monday and Tuesday she had a fever Pt states she has right flank pain that started today  Pt denies fever on Wednesday but states she is feeling fatigued

## 2018-05-20 IMAGING — CT CT RENAL STONE PROTOCOL
2 of 3 series · 16 of 46 positions shown, 18 images · non-contrast
Comparison: CT 05/07/2016

CLINICAL DATA: Flank pain, stone disease suspected.

EXAM:
CT ABDOMEN AND PELVIS WITHOUT CONTRAST
TECHNIQUE: Multidetector CT imaging of the abdomen and pelvis was performed
following the standard protocol without IV contrast.

[Series 3: lung · axial · 0.72mm/px · z∈[-162,-82]mm · 13 of 48 slices shown, 15 images]
[im 4/48  soft-tissue]
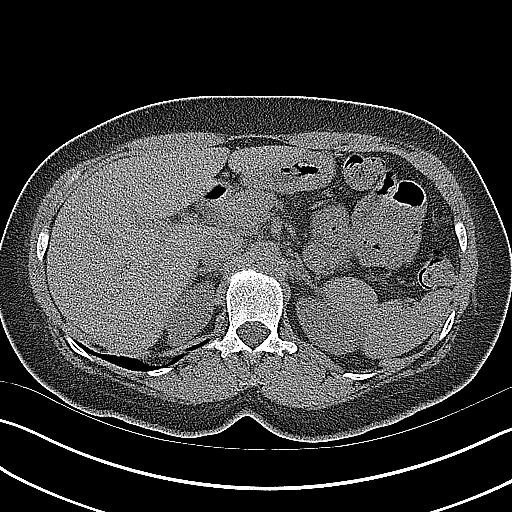
[im 4/48  bone]
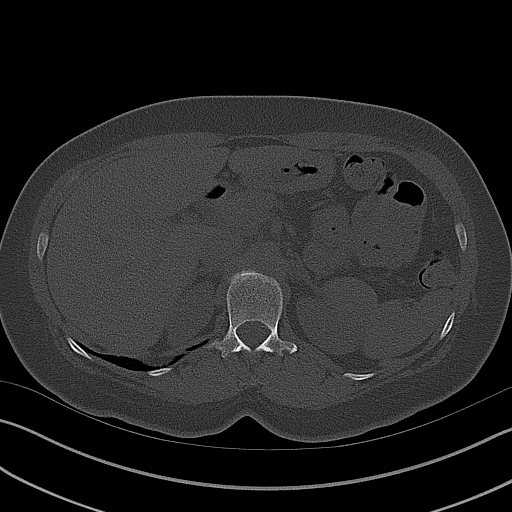
[im 7/48  soft-tissue]
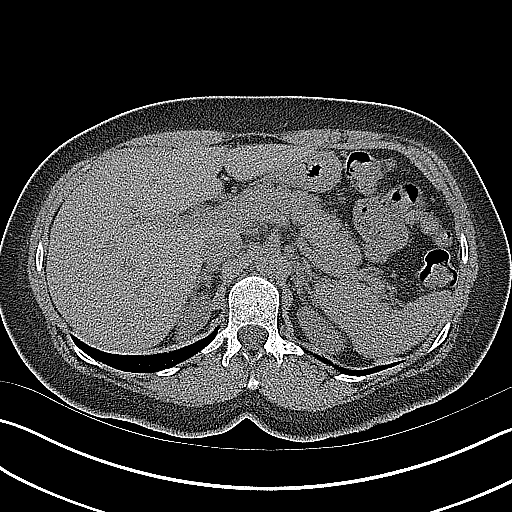
[im 10/48  soft-tissue]
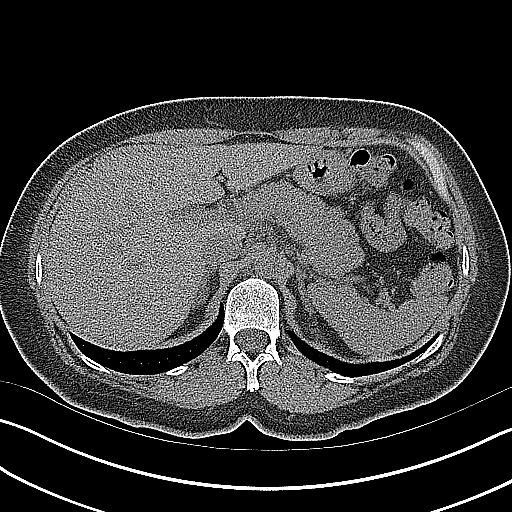
[im 14/48  soft-tissue]
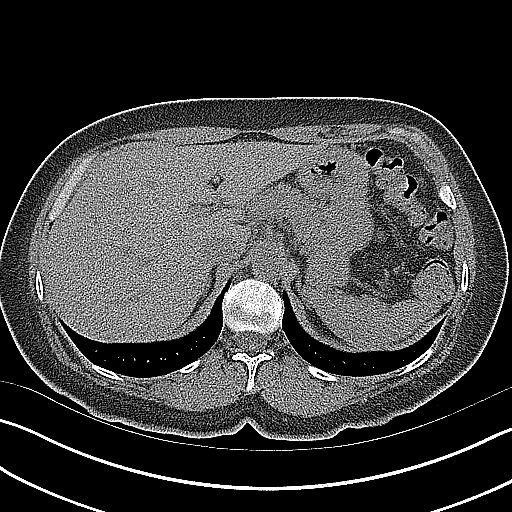
[im 17/48  soft-tissue]
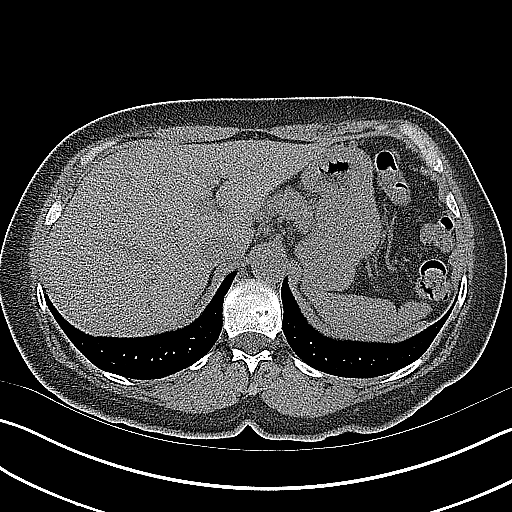
[im 20/48  soft-tissue]
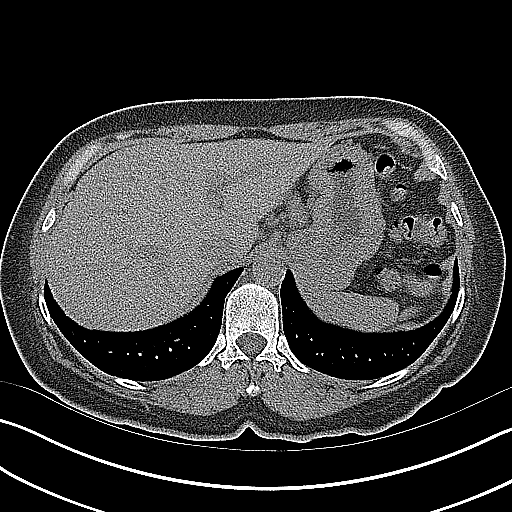
[im 25/48  soft-tissue]
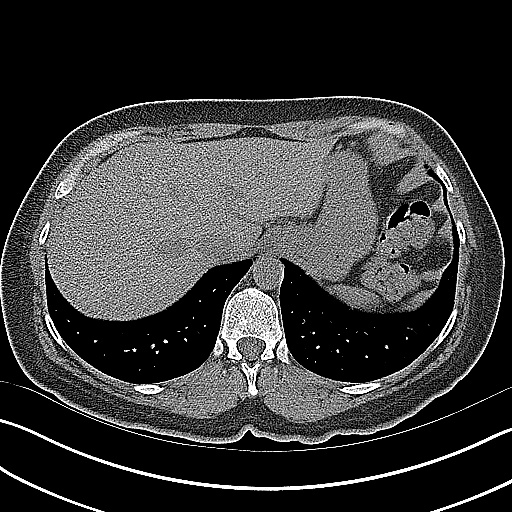
[im 28/48  soft-tissue]
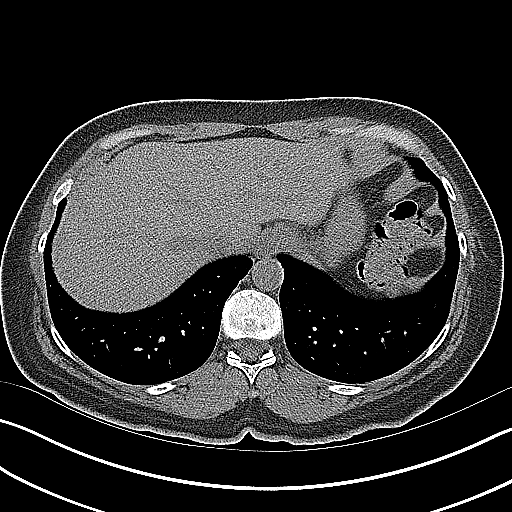
[im 31/48  soft-tissue]
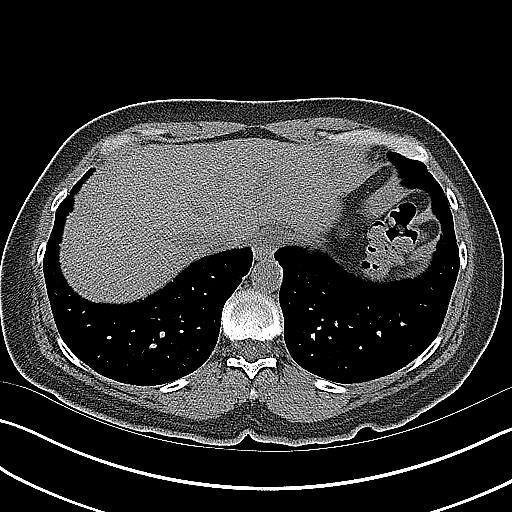
[im 31/48  bone]
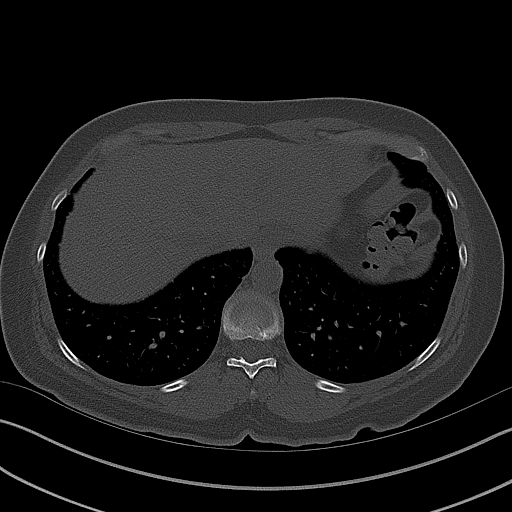
[im 34/48  soft-tissue]
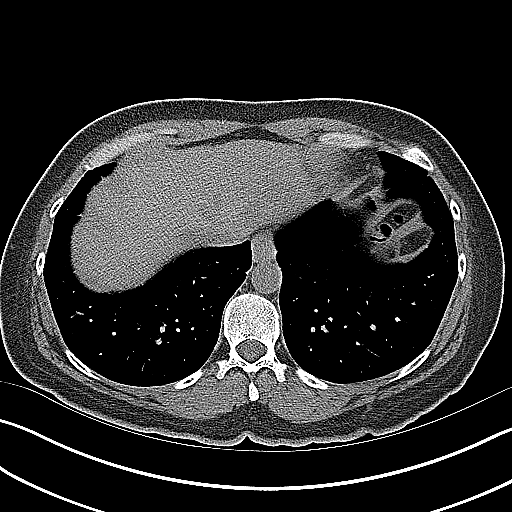
[im 38/48  soft-tissue]
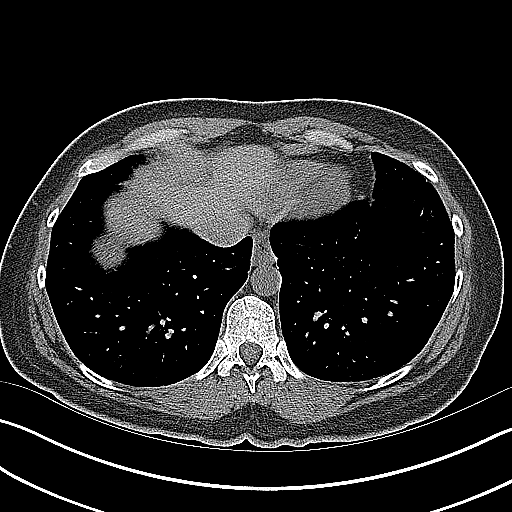
[im 41/48  soft-tissue]
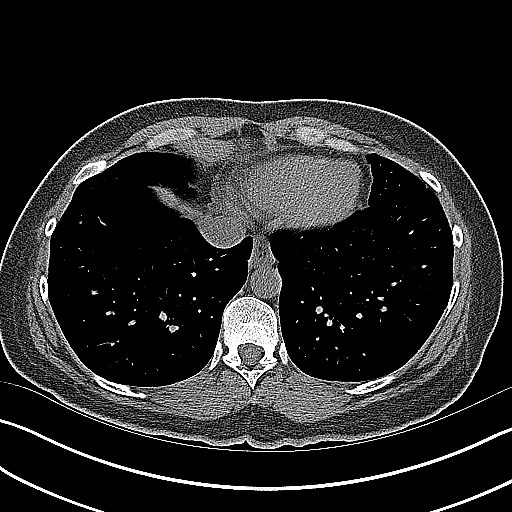
[im 44/48  soft-tissue]
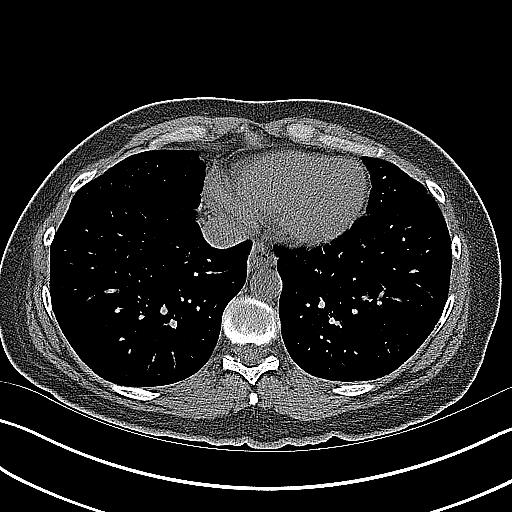

[Series 4: coronal · coronal · 0.70mm/px · 3 of 119 slices shown]
[im 40/119  soft-tissue]
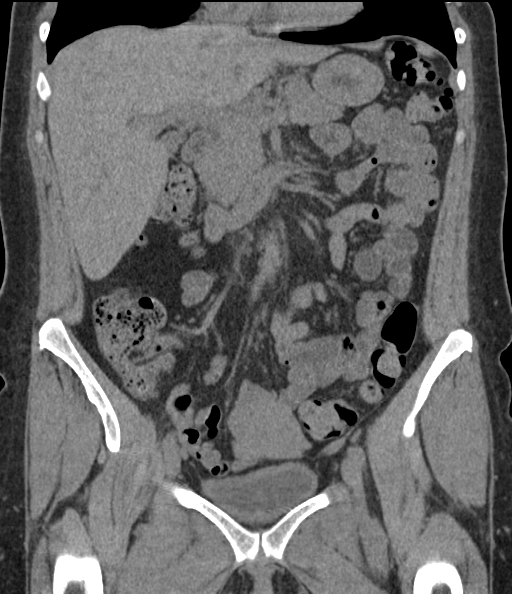
[im 53/119  soft-tissue]
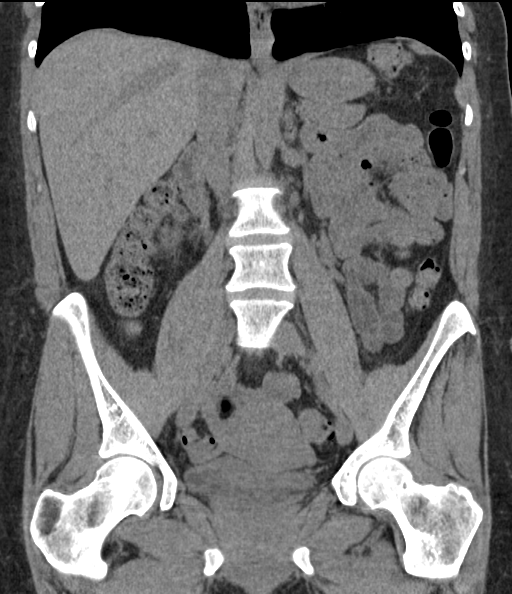
[im 66/119  soft-tissue]
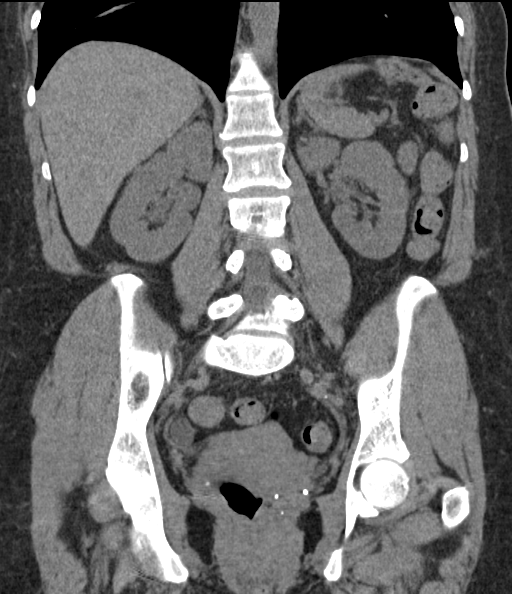

[16 of 46 positions shown; findings below may reference images not displayed]

FINDINGS: Lower chest: The lung bases are clear.

Hepatobiliary: No focal hepatic lesion allowing for lack contrast.
Gallbladder physiologically distended, no calcified stone. No
biliary dilatation.

Pancreas: No ductal dilatation or inflammation.

Spleen: Normal in size without focal abnormality.

Adrenals/Urinary Tract: Normal adrenal glands.

No hydronephrosis or perinephric edema. Calcification in the upper
right kidney may be parenchymal, less likely nonobstructing stone.
Both ureters are decompressed without stones along the course.
Urinary bladder is minimally distended without bladder stone.

Stomach/Bowel: Stomach is within normal limits. Appendix is
surgically absent. No evidence of bowel wall thickening, distention,
or inflammatory changes.

Vascular/Lymphatic: No adenopathy. Normal caliber abdominal aorta
which trace atherosclerosis.

Reproductive: Uterus and bilateral adnexa are unremarkable.

Other: No free air, free fluid, or intra-abdominal fluid collection.

Musculoskeletal: There are no acute or suspicious osseous
abnormalities.
IMPRESSION: 1. No hydronephrosis or obstructive uropathy. Possible punctate
right renal stone versus parenchymal calcification.
2. Mild aortic atherosclerosis.

## 2020-03-14 ENCOUNTER — Other Ambulatory Visit: Payer: Self-pay | Admitting: Obstetrics & Gynecology

## 2020-03-14 ENCOUNTER — Other Ambulatory Visit: Payer: Self-pay

## 2020-03-14 ENCOUNTER — Ambulatory Visit (INDEPENDENT_AMBULATORY_CARE_PROVIDER_SITE_OTHER): Payer: 59 | Admitting: Obstetrics & Gynecology

## 2020-03-14 ENCOUNTER — Encounter: Payer: Self-pay | Admitting: Obstetrics & Gynecology

## 2020-03-14 VITALS — BP 139/66 | HR 79 | Ht 68.0 in | Wt 232.0 lb

## 2020-03-14 DIAGNOSIS — Z01419 Encounter for gynecological examination (general) (routine) without abnormal findings: Secondary | ICD-10-CM

## 2020-03-14 DIAGNOSIS — Z1211 Encounter for screening for malignant neoplasm of colon: Secondary | ICD-10-CM | POA: Diagnosis not present

## 2020-03-14 DIAGNOSIS — Z1231 Encounter for screening mammogram for malignant neoplasm of breast: Secondary | ICD-10-CM | POA: Diagnosis not present

## 2020-03-14 DIAGNOSIS — Z1272 Encounter for screening for malignant neoplasm of vagina: Secondary | ICD-10-CM

## 2020-03-14 DIAGNOSIS — N898 Other specified noninflammatory disorders of vagina: Secondary | ICD-10-CM

## 2020-03-14 DIAGNOSIS — B354 Tinea corporis: Secondary | ICD-10-CM

## 2020-03-14 NOTE — Progress Notes (Signed)
Patient states last period August 2020 (and the one before that Jan 2020).

## 2020-03-14 NOTE — Progress Notes (Signed)
Subjective:     Morgan Orozco is a 49 y.o. female here for a routine exam.[id number] her son was killed 2 years prev. She has not been sexually active for 2 years. 3 years prev she was dx'd with trich. She was treated. She reports a 'sticky' discharge that has no odor but, seems like the same discharge she had when she had trich. Last mammogram >3 years prev.  LMP in 06/2019.   Prior to that she went ~42month.   Pt reports increased weight over the past year.   Pt ate breakfast earlier this am.     Gynecologic History No LMP recorded (exact date). Contraception: abstinence/BTL Last Pap: > 3 years Last mammogram: > 3 years  Obstetric History OB History  Gravida Para Term Preterm AB Living  _0 SAB TAB Ectopic Multiple Live Births  _1 # Outcome Date GA Lbr Len/2nd Weight Sex Delivery Anes PTL Lv  7 TAB           6 TAB           5 Term      Vag-Spont     4 Term      Vag-Spont     3 Term      Vag-Spont     2 SAB           1 SAB            The following portions of the patient's history were reviewed and updated as appropriate: allergies, current medications, past family history, past medical history, past social history, past surgical history and problem list.  Review of Systems Pertinent items are noted in HPI.    Objective:  BP 139/66   Pulse 79   Ht _2  (1.727 m)   Wt 232 lb (105.2 kg)   LMP  (Exact Date) Comment: August 2020  BMI 35.28 kg/m  General Appearance:    Alert, cooperative, no distress, appears stated age  Head:    Normocephalic, without obvious abnormality, atraumatic  Eyes:    conjunctiva/corneas clear, EOM's intact, both eyes  Ears:    Normal external ear canals, both ears  Nose:   Nares normal, septum midline, mucosa normal, no drainage    or sinus tenderness  Throat:   Lips, mucosa, and tongue normal; teeth and gums normal  Neck:   Supple, symmetrical, trachea midline, no adenopathy;    thyroid:  no enlargement/tenderness/nodules   Back:     Symmetric, no curvature, ROM normal, no CVA tenderness  Lungs:     respirations unlabored  Chest Wall:    No tenderness or deformity   Heart:    Regular rate and rhythm  Breast Exam:    No tenderness, masses, or nipple abnormality; has yeast under her breasts.   Abdomen:     Soft, non-tender, bowel sounds active all four quadrants,    no masses, no organomegaly  Genitalia:    Normal female without lesion, discharge or tenderness; uterus small and mobile. No adnexal masses. No abnormal discharge noted.      Extremities:   Extremities normal, atraumatic, no cyanosis or edema  Pulses:   2+ and symmetric all extremities  Skin:   Skin color, texture, turgor normal, no rashes or lesions    Assessment:    Healthy female exam.   Tinea under her breast Weight gain 50#s over the last year  Plan:   Morgan Orozco was seen today for annual exam.  Diagnoses and all orders for this visit:  Well female exam with routine gynecological exam -     Pap IG, Ct-Ng TV rfx HPV all -     CBC -     Comp Met (CMET) -     Lipid panel -     TSH -     HgB A1c  Encounter for screening mammogram for malignant neoplasm of breast -     MM Digital Screening; Future  Colon cancer screening -     Ambulatory referral to Gastroenterology  Vaginal discharge -     NuSwab VG+, Candida 6sp  Antifungal cream under breasts.   F/u in 1 year or sooner prn  Margarit Minshall L. Harraway-Smith, M.D., Cherlynn June

## 2020-03-15 ENCOUNTER — Telehealth: Payer: Self-pay | Admitting: *Deleted

## 2020-03-15 LAB — COMPREHENSIVE METABOLIC PANEL
ALT: 18 IU/L (ref 0–32)
AST: 19 IU/L (ref 0–40)
Albumin/Globulin Ratio: 1.6 (ref 1.2–2.2)
Albumin: 5 g/dL — ABNORMAL HIGH (ref 3.8–4.8)
Alkaline Phosphatase: 174 IU/L — ABNORMAL HIGH (ref 39–117)
BUN/Creatinine Ratio: 16 (ref 9–23)
BUN: 14 mg/dL (ref 6–24)
Bilirubin Total: 0.3 mg/dL (ref 0.0–1.2)
CO2: 18 mmol/L — ABNORMAL LOW (ref 20–29)
Calcium: 10.2 mg/dL (ref 8.7–10.2)
Chloride: 104 mmol/L (ref 96–106)
Creatinine, Ser: 0.89 mg/dL (ref 0.57–1.00)
GFR calc Af Amer: 88 mL/min/{1.73_m2} (ref >59)
GFR calc non Af Amer: 76 mL/min/{1.73_m2} (ref >59)
Globulin, Total: 3.1 g/dL (ref 1.5–4.5)
Glucose: 99 mg/dL (ref 65–99)
Potassium: 3.9 mmol/L (ref 3.5–5.2)
Sodium: 141 mmol/L (ref 134–144)
Total Protein: 8.1 g/dL (ref 6.0–8.5)

## 2020-03-15 LAB — CBC
Hematocrit: 44.5 % (ref 34.0–46.6)
Hemoglobin: 15.1 g/dL (ref 11.1–15.9)
MCH: 31.3 pg (ref 26.6–33.0)
MCHC: 33.9 g/dL (ref 31.5–35.7)
MCV: 92 fL (ref 79–97)
Platelets: 283 10*3/uL (ref 150–450)
RBC: 4.83 x10E6/uL (ref 3.77–5.28)
RDW: 12.3 % (ref 11.7–15.4)
WBC: 6.5 10*3/uL (ref 3.4–10.8)

## 2020-03-15 LAB — LIPID PANEL
Chol/HDL Ratio: 2.3 ratio (ref 0.0–4.4)
Cholesterol, Total: 171 mg/dL (ref 100–199)
HDL: 74 mg/dL (ref >39)
LDL Chol Calc (NIH): 85 mg/dL (ref 0–99)
Triglycerides: 63 mg/dL (ref 0–149)
VLDL Cholesterol Cal: 12 mg/dL (ref 5–40)

## 2020-03-15 LAB — HEMOGLOBIN A1C
Est. average glucose Bld gHb Est-mCnc: 103 mg/dL
Hgb A1c MFr Bld: 5.2 % (ref 4.8–5.6)

## 2020-03-15 LAB — TSH: TSH: 3.01 u[IU]/mL (ref 0.450–4.500)

## 2020-03-15 NOTE — Telephone Encounter (Signed)
Imaging called regarding patent requesting another contact number to get patient scheduled stated they made a call and was told they had the wrong number. I gave them the number we have in system for patient as that is the only number listed... Patient has not been scheduled for mammogram due to not being able to get in contact with the patient.Marland KitchenMarland KitchenMarland Kitchen

## 2020-03-17 LAB — NUSWAB VG+, CANDIDA 6SP
Atopobium vaginae: HIGH Score — AB
C PARAPSILOSIS/TROPICALIS: NEGATIVE
Candida albicans, NAA: NEGATIVE
Candida glabrata, NAA: NEGATIVE
Candida krusei, NAA: NEGATIVE
Candida lusitaniae, NAA: NEGATIVE
Chlamydia trachomatis, NAA: NEGATIVE
Megasphaera 1: HIGH Score — AB
Neisseria gonorrhoeae, NAA: NEGATIVE
Trich vag by NAA: POSITIVE — AB

## 2020-03-20 ENCOUNTER — Telehealth: Payer: Self-pay

## 2020-03-20 DIAGNOSIS — A599 Trichomoniasis, unspecified: Secondary | ICD-10-CM

## 2020-03-20 LAB — IGP,CTNGTV,RFX APT HPV ALL
Chlamydia, Nuc. Acid Amp: NEGATIVE
Gonococcus, Nuc. Acid Amp: NEGATIVE
PAP Smear Comment: 0
Trich vag by NAA: POSITIVE — AB

## 2020-03-20 MED ORDER — METRONIDAZOLE 500 MG PO TABS: ORAL_TABLET | ORAL | 0 refills | Status: DC

## 2020-03-20 NOTE — Telephone Encounter (Signed)
Called pt regarding results. Pt made aware that she tested positive for Trichomonas. Flagyl 2 grams PO 1 dose was sent to pt's pharmacy. Advised pt abstain from sex for 7 days after she and partner are treated. Understanding was voiced.  Jadalyn Oliveri l Skylyn Slezak, CMA

## 2020-03-29 ENCOUNTER — Other Ambulatory Visit: Payer: Self-pay

## 2020-03-29 ENCOUNTER — Other Ambulatory Visit: Payer: Self-pay | Admitting: Obstetrics & Gynecology

## 2020-03-29 DIAGNOSIS — B379 Candidiasis, unspecified: Secondary | ICD-10-CM

## 2020-03-29 MED ORDER — FLUCONAZOLE 150 MG PO TABS: ORAL_TABLET | ORAL | 1 refills | Status: DC

## 2020-03-29 NOTE — Progress Notes (Signed)
Pt called stating she is having some itchy discharge x 3 days. Pt states she is prone to yeast infections after taking antibiotics. Diflucan 150 mg was sent to the pt's pharmacy.  Tucker Steedley l Alayjah Boehringer, CMA

## 2020-04-02 ENCOUNTER — Encounter: Payer: Self-pay | Admitting: Gastroenterology

## 2020-04-26 ENCOUNTER — Other Ambulatory Visit: Payer: Self-pay | Admitting: Obstetrics & Gynecology

## 2020-05-07 DIAGNOSIS — F419 Anxiety disorder, unspecified: Secondary | ICD-10-CM | POA: Insufficient documentation

## 2020-05-07 DIAGNOSIS — G43909 Migraine, unspecified, not intractable, without status migrainosus: Secondary | ICD-10-CM | POA: Insufficient documentation

## 2020-05-18 ENCOUNTER — Encounter: Payer: Self-pay | Admitting: Gastroenterology

## 2020-05-18 ENCOUNTER — Other Ambulatory Visit: Payer: Self-pay

## 2020-05-18 ENCOUNTER — Ambulatory Visit (AMBULATORY_SURGERY_CENTER): Payer: Self-pay | Admitting: *Deleted

## 2020-05-18 VITALS — Ht 68.0 in | Wt 236.0 lb

## 2020-05-18 DIAGNOSIS — Z01818 Encounter for other preprocedural examination: Secondary | ICD-10-CM

## 2020-05-18 DIAGNOSIS — Z1211 Encounter for screening for malignant neoplasm of colon: Secondary | ICD-10-CM

## 2020-05-18 MED ORDER — SUTAB 1479-225-188 MG PO TABS: 1.0000 | ORAL_TABLET | ORAL | 0 refills | Status: DC

## 2020-05-18 NOTE — Progress Notes (Signed)
Patient is here in-person for PV. Patient denies any allergies to eggs or soy. Patient denies any problems with anesthesia/sedation. Patient denies any oxygen use at home. Patient denies taking any diet/weight loss medications or blood thinners. Patient is not being treated for MRSA or C-diff. Patient is aware of our care-partner policy and ZOXWR-60 safety protocol. EMMI education assisgned to the patient for the procedure, this was explained and instructions given to patient.   COVID-19 screening test is on 7/7, the pt is aware.   Prep Prescription coupon given to the patient.

## 2020-05-30 ENCOUNTER — Ambulatory Visit (INDEPENDENT_AMBULATORY_CARE_PROVIDER_SITE_OTHER): Payer: 59

## 2020-05-30 ENCOUNTER — Other Ambulatory Visit: Payer: Self-pay | Admitting: Gastroenterology

## 2020-05-30 DIAGNOSIS — Z1159 Encounter for screening for other viral diseases: Secondary | ICD-10-CM

## 2020-05-31 ENCOUNTER — Encounter: Payer: Self-pay | Admitting: Certified Registered Nurse Anesthetist

## 2020-05-31 LAB — SARS CORONAVIRUS 2 (TAT 6-24 HRS): SARS Coronavirus 2: NEGATIVE

## 2020-06-01 ENCOUNTER — Other Ambulatory Visit: Payer: Self-pay

## 2020-06-01 ENCOUNTER — Ambulatory Visit (AMBULATORY_SURGERY_CENTER): Payer: 59 | Admitting: Gastroenterology

## 2020-06-01 ENCOUNTER — Encounter: Payer: Self-pay | Admitting: Gastroenterology

## 2020-06-01 VITALS — BP 123/73 | HR 70 | Temp 97.5°F | Resp 23 | Ht 68.0 in | Wt 236.0 lb

## 2020-06-01 DIAGNOSIS — D125 Benign neoplasm of sigmoid colon: Secondary | ICD-10-CM

## 2020-06-01 DIAGNOSIS — D123 Benign neoplasm of transverse colon: Secondary | ICD-10-CM | POA: Diagnosis not present

## 2020-06-01 DIAGNOSIS — Z1211 Encounter for screening for malignant neoplasm of colon: Secondary | ICD-10-CM | POA: Diagnosis not present

## 2020-06-01 MED ORDER — SODIUM CHLORIDE 0.9 % IV SOLN: 500.0000 mL | Freq: Once | INTRAVENOUS | Status: DC

## 2020-06-01 NOTE — Progress Notes (Signed)
Vs CW I have reviewed the patient's medical history in detail and updated the computerized patient record.   

## 2020-06-01 NOTE — Patient Instructions (Signed)
Handouts given for polyps and hemorrhoids.  Await pathology results.  YOU HAD AN ENDOSCOPIC PROCEDURE TODAY AT THE  ENDOSCOPY CENTER:   Refer to the procedure report that was given to you for any specific questions about what was found during the examination.  If the procedure report does not answer your questions, please call your gastroenterologist to clarify.  If you requested that your care partner not be given the details of your procedure findings, then the procedure report has been included in a sealed envelope for you to review at your convenience later.  YOU SHOULD EXPECT: Some feelings of bloating in the abdomen. Passage of more gas than usual.  Walking can help get rid of the air that was put into your GI tract during the procedure and reduce the bloating. If you had a lower endoscopy (such as a colonoscopy or flexible sigmoidoscopy) you may notice spotting of blood in your stool or on the toilet paper. If you underwent a bowel prep for your procedure, you may not have a normal bowel movement for a few days.  Please Note:  You might notice some irritation and congestion in your nose or some drainage.  This is from the oxygen used during your procedure.  There is no need for concern and it should clear up in a day or so.  SYMPTOMS TO REPORT IMMEDIATELY:   Following lower endoscopy (colonoscopy or flexible sigmoidoscopy):  Excessive amounts of blood in the stool  Significant tenderness or worsening of abdominal pains  Swelling of the abdomen that is new, acute  Fever of 100F or higher  For urgent or emergent issues, a gastroenterologist can be reached at any hour by calling [phone]. Do not use MyChart messaging for urgent concerns.    DIET:  We do recommend a small meal at first, but then you may proceed to your regular diet.  Drink plenty of fluids but you should avoid alcoholic beverages for 24 hours.  ACTIVITY:  You should plan to take it easy for the rest of today  and you should NOT DRIVE or use heavy machinery until tomorrow (because of the sedation medicines used during the test).    FOLLOW UP: Our staff will call the number listed on your records 48-72 hours following your procedure to check on you and address any questions or concerns that you may have regarding the information given to you following your procedure. If we do not reach you, we will leave a message.  We will attempt to reach you two times.  During this call, we will ask if you have developed any symptoms of COVID 19. If you develop any symptoms (ie: fever, flu-like symptoms, shortness of breath, cough etc.) before then, please call [phone].  If you test positive for Covid 19 in the 2 weeks post procedure, please call and report this information to us.    If any biopsies were taken you will be contacted by phone or by letter within the next 1-3 weeks.  Please call us at [phone] if you have not heard about the biopsies in 3 weeks.    SIGNATURES/CONFIDENTIALITY: You and/or your care partner have signed paperwork which will be entered into your electronic medical record.  These signatures attest to the fact that that the information above on your After Visit Summary has been reviewed and is understood.  Full responsibility of the confidentiality of this discharge information lies with you and/or your care-partner. 

## 2020-06-01 NOTE — Progress Notes (Signed)
Report given to PACU, vss 

## 2020-06-01 NOTE — Progress Notes (Signed)
VS-WR  Pt's states no medical or surgical changes since previsit or office visit.

## 2020-06-01 NOTE — Op Note (Addendum)
[facility] Patient Name: Morgan Orozco Procedure Date: 06/01/2020 10:23 AM MRN: [id number] Endoscopist: Thornton Park MD, MD Age: 49 Referring MD:  Date of Birth: 04/01/1971 Gender: Female Account #: [id number] Procedure:                Colonoscopy Indications:              Screening for colorectal malignant neoplasm, This                            is the patient's first colonoscopy                           Maternal grandfather with colon cancer. No other                            known family history of colon cancer or polyps Medicines:                Monitored Anesthesia Care Procedure:                Pre-Anesthesia Assessment:                           - Prior to the procedure, a History and Physical                            was performed, and patient medications and                            allergies were reviewed. The patient's tolerance of                            previous anesthesia was also reviewed. The risks                            and benefits of the procedure and the sedation                            options and risks were discussed with the patient.                            All questions were answered, and informed consent                            was obtained. Prior Anticoagulants: The patient has                            taken no previous anticoagulant or antiplatelet                            agents. ASA Grade Assessment: II - A patient with                            mild systemic disease. After reviewing the risks  and benefits, the patient was deemed in                            satisfactory condition to undergo the procedure.                           After obtaining informed consent, the colonoscope                            was passed under direct vision. Throughout the                            procedure, the patient's blood pressure, pulse, and                            oxygen saturations were  monitored continuously. The                            Colonoscope was introduced through the anus and                            advanced to the 3 cm into the ileum. A second                            forward view of the right colon was performed. The                            colonoscopy was performed without difficulty. The                            patient tolerated the procedure well. The quality                            of the bowel preparation was good. The terminal                            ileum, ileocecal valve, appendiceal orifice, and                            rectum were photographed. Scope In: 10:33:52 AM Scope Out: 10:48:53 AM Scope Withdrawal Time: 0 hours 9 minutes 37 seconds  Total Procedure Duration: 0 hours 15 minutes 1 second  Findings:                 The perianal and digital rectal examinations were                            normal.                           A 2 mm polyp was found in the sigmoid colon. The                            polyp was flat. The polyp was removed with a cold  snare. Resection and retrieval were complete.                            Estimated blood loss was minimal.                           Two sessile polyps were found in the distal                            transverse colon. The polyps were 1 to 2 mm in                            size. These polyps were removed with a cold snare.                            Resection and retrieval were complete. Estimated                            blood loss was minimal.                           The exam was otherwise without abnormality on                            direct and retroflexion views except for small                            internal hemorrhoids. Complications:            No immediate complications. Estimated blood loss:                            Minimal. Estimated Blood Loss:     Estimated blood loss was minimal. Impression:               - One 2 mm  polyp in the sigmoid colon, removed with                            a cold snare. Resected and retrieved.                           - Two 1 to 2 mm polyps in the distal transverse                            colon, removed with a cold snare. Resected and                            retrieved.                           - Small internal hemorrhoids. Recommendation:           - Patient has a contact number available for                            emergencies.  The signs and symptoms of potential                            delayed complications were discussed with the                            patient. Return to normal activities tomorrow.                            Written discharge instructions were provided to the                            patient.                           - Resume previous diet.                           - Continue present medications.                           - Await pathology results.                           - Repeat colonoscopy date to be determined after                            pending pathology results are reviewed for                            surveillance.                           - Emerging evidence supports eating a diet of                            fruits, vegetables, grains, calcium, and yogurt                            while reducing red meat and alcohol may reduce the                            risk of colon cancer.                           - Thank you for allowing me to be involved in your                            colon cancer prevention. Thornton Park MD, MD 06/01/2020 10:53:29 AM This report has been signed electronically.

## 2020-06-05 ENCOUNTER — Telehealth: Payer: Self-pay

## 2020-06-05 NOTE — Telephone Encounter (Signed)
  Follow up Call-  Call back number 06/01/2020  Post procedure Call Back phone  # [phone]  Permission to leave phone message Yes  Some recent data might be hidden     Patient questions:  Do you have a fever, pain , or abdominal swelling? No. Pain Score  0 *  Have you tolerated food without any problems? Yes.    Have you been able to return to your normal activities? Yes.    Do you have any questions about your discharge instructions: Diet   No. Medications  No. Follow up visit  No.  Do you have questions or concerns about your Care? No.  Actions: * If pain score is 4 or above: No action needed, pain <4.  1. Have you developed a fever since your procedure? no  2.   Have you had an respiratory symptoms (SOB or cough) since your procedure? no  3.   Have you tested positive for COVID 19 since your procedure no  4.   Have you had any family members/close contacts diagnosed with the COVID 19 since your procedure?  no   If yes to any of these questions please route to Joylene John, RN and Erenest Rasher, RN

## 2020-06-13 ENCOUNTER — Ambulatory Visit: Payer: 59 | Admitting: Obstetrics & Gynecology

## 2020-06-14 ENCOUNTER — Encounter: Payer: Self-pay | Admitting: Gastroenterology
